# Patient Record
Sex: Female | Born: 1990 | Race: White | Hispanic: No | Marital: Married | Smoking: Never smoker
Health system: Southern US, Community
[De-identification: ages and names within clinical notes are randomized; demographics above are authoritative.]

## PROBLEM LIST (undated history)

## (undated) ENCOUNTER — Inpatient Hospital Stay (HOSPITAL_COMMUNITY): Payer: Self-pay

## (undated) DIAGNOSIS — O365990 Maternal care for other known or suspected poor fetal growth, unspecified trimester, not applicable or unspecified: Secondary | ICD-10-CM

## (undated) DIAGNOSIS — J45909 Unspecified asthma, uncomplicated: Secondary | ICD-10-CM

## (undated) DIAGNOSIS — F419 Anxiety disorder, unspecified: Secondary | ICD-10-CM

## (undated) DIAGNOSIS — T7840XA Allergy, unspecified, initial encounter: Secondary | ICD-10-CM

## (undated) DIAGNOSIS — F329 Major depressive disorder, single episode, unspecified: Secondary | ICD-10-CM

## (undated) DIAGNOSIS — F32A Depression, unspecified: Secondary | ICD-10-CM

## (undated) DIAGNOSIS — I10 Essential (primary) hypertension: Secondary | ICD-10-CM

## (undated) DIAGNOSIS — O142 HELLP syndrome: Secondary | ICD-10-CM

## (undated) HISTORY — DX: Allergy, unspecified, initial encounter: T78.40XA

## (undated) HISTORY — PX: WISDOM TOOTH EXTRACTION: SHX21

## (undated) HISTORY — PX: APPENDECTOMY: SHX54

---
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
## 2010-04-01 ENCOUNTER — Encounter: Admission: RE | Admit: 2010-04-01 | Discharge: 2010-04-01 | Payer: Self-pay | Admitting: Gastroenterology

## 2010-04-29 ENCOUNTER — Ambulatory Visit (HOSPITAL_COMMUNITY): Admission: RE | Admit: 2010-04-29 | Discharge: 2010-04-29 | Payer: Self-pay | Admitting: Gastroenterology

## 2010-10-26 ENCOUNTER — Encounter: Payer: Self-pay | Admitting: Gastroenterology

## 2012-09-25 ENCOUNTER — Ambulatory Visit (INDEPENDENT_AMBULATORY_CARE_PROVIDER_SITE_OTHER): Payer: BC Managed Care – PPO | Admitting: Physician Assistant

## 2012-09-25 VITALS — BP 118/82 | HR 67 | Temp 98.8°F | Resp 16 | Ht 67.0 in | Wt 138.0 lb

## 2012-09-25 DIAGNOSIS — H6980 Other specified disorders of Eustachian tube, unspecified ear: Secondary | ICD-10-CM

## 2012-09-25 DIAGNOSIS — H6990 Unspecified Eustachian tube disorder, unspecified ear: Secondary | ICD-10-CM

## 2012-09-25 DIAGNOSIS — J029 Acute pharyngitis, unspecified: Secondary | ICD-10-CM

## 2012-09-25 MED ORDER — FLUTICASONE PROPIONATE 50 MCG/ACT NA SUSP: 2.0000 | Freq: Every day | NASAL | Status: DC

## 2012-09-25 NOTE — Progress Notes (Signed)
  Subjective:    Patient ID: Isabel Hubbard, female    DOB: [birth year], 21 y.o.   MRN: [id number]  HPI 21 yr old CF presents with sore throat that started in the middle of the night. 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  She works in the [employer].  No known exposures to strep or flu.  No f/c. No cough, runny nose.  Review of Systems  All other systems reviewed and are negative.       Objective:   Physical Exam  Nursing note and vitals reviewed. Constitutional: She appears well-developed and well-nourished.  HENT:  Head: Normocephalic and atraumatic.  Right Ear: External ear normal.  Left Ear: External ear normal.  Mouth/Throat: No oropharyngeal exudate (throat with erythema, no exudate. ).  Neck: Normal range of motion. Neck supple.  Cardiovascular: Normal rate, regular rhythm and normal heart sounds.   Pulmonary/Chest: Effort normal and breath sounds normal.  Lymphadenopathy:    She has no cervical adenopathy.    Results for orders placed in visit on 09/25/12  POCT RAPID STREP A (OFFICE)      Component Value Range   Rapid Strep A Screen Negative  Negative       Assessment & Plan:  Non strep pharyngitis-salt water gargles, tylenol or advil Eustachian tube dysfunction-start flonase and mucinex D Fluids and rest.

## 2013-01-11 ENCOUNTER — Ambulatory Visit (INDEPENDENT_AMBULATORY_CARE_PROVIDER_SITE_OTHER): Payer: BC Managed Care – PPO | Admitting: Family Medicine

## 2013-01-11 VITALS — BP 120/74 | HR 114 | Temp 99.1°F | Resp 16 | Ht 66.25 in | Wt 131.0 lb

## 2013-01-11 DIAGNOSIS — J029 Acute pharyngitis, unspecified: Secondary | ICD-10-CM

## 2013-01-11 DIAGNOSIS — N946 Dysmenorrhea, unspecified: Secondary | ICD-10-CM

## 2013-01-11 DIAGNOSIS — Z23 Encounter for immunization: Secondary | ICD-10-CM

## 2013-01-11 DIAGNOSIS — Z0289 Encounter for other administrative examinations: Secondary | ICD-10-CM

## 2013-01-11 LAB — POCT URINE PREGNANCY: Preg Test, Ur: NEGATIVE

## 2013-01-11 NOTE — Patient Instructions (Addendum)
I recommend frequent warm salt water gargles, hot tea with honey and lemon, rest, and handwashing.  Hot showers or breathing in steam may help loosen any congestion.

## 2013-01-11 NOTE — Progress Notes (Signed)
Subjective:    Patient ID: Isabel Hubbard, female    DOB: 04/08/1991, 22 y.o.   MRN: [id number] Chief Complaint  Patient presents with  . administrative PE  . Immunizations    TB and Tdap  . 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  Brought her UTD immunizations forms w/ her. Had a Td in 2012 but has not had a pertussis update.  Has been on birth control for years. Never misses it.  Since starting it she rarely gets her period - just gets her period about twice a year though she is taking all the placebo pills. Her gynecologist told her that was normal but it always makes her nervous that she could be pregnant and not know it.  Woke up this morning with a sore throat. Has been having a little congestion but not bad. Felt a lot of chills today and feels warm to but unsure about temp.  No cough.   Tuberculosis Risk Questionnaire  1. Were you born outside the [place] in one of the following parts of the world:    [place], [place], [place], [place] or [place]?  no  2. Have you traveled outside the [place] and lived for more than one month in one of the following parts of the world:  [place], [place], [place], [place] or [place]?  No  3. Do you have a compromised immune system such as from any of the following conditions:  HIV/AIDS, organ or bone marrow transplantation, diabetes, immunosuppressive   medicines (e.g. Prednisone, Remicaide), leukemia, lymphoma, cancer of the   head or neck, gastrectomy or jejunal bypass, end-stage renal disease (on   dialysis), or silicosis?  no    4. Have you ever done one of the following:    Used crack cocaine, injected illegal drugs, worked or resided in jail or prison,   worked or resided at a homeless shelter, or worked as a Research scientist (physical sciences) in   direct contact with patients?  no  5. Have you ever been exposed to anyone with  infectious tuberculosis?  No    Tuberculosis Symptom Questionnaire  Do you currently have any of the following symptoms?  1. Unexplained cough lasting more than 3 weeks? no  Unexplained fever lasting more than 3 weeks. no  3. Night Sweats (sweating that leaves the bedclothes and sheets wet)   no 4. Shortness of Breath no  5. Chest Pain no  6. Unintentional weight loss  no  7. Unexplained fatigue (very tired for no reason) no  To Whom It May Concern:  This patient was screened for tuberculosis using current guidelines from the Centers for Disease Control, and does not have tuberculosis in the communicable form.   Review of Systems  Constitutional: Positive for chills. Negative for fever, diaphoresis, activity change, appetite change, fatigue and unexpected weight change.  HENT: Positive for sore throat and rhinorrhea. Negative for ear pain, nosebleeds, congestion, sneezing, mouth sores, trouble swallowing, neck pain, neck stiffness, voice change, postnasal drip, sinus pressure and ear discharge.   Eyes: Negative for pain and itching.  Respiratory: Negative for cough and shortness of breath.   Cardiovascular: Negative for chest pain.  Gastrointestinal: Negative for nausea, vomiting, abdominal pain, diarrhea and constipation.  Genitourinary: Negative for dysuria, menstrual problem and pelvic pain.  Musculoskeletal: Negative for myalgias, joint swelling, arthralgias and gait problem.  Neurological:  Negative for dizziness, syncope and headaches.  Hematological: Negative for adenopathy.  Psychiatric/Behavioral: Negative for sleep disturbance.      BP 120/74  Pulse 114  Temp(Src) 99.1 F (37.3 C) (Oral)  Resp 16  Ht 5' 6.25" (1.683 m)  Wt 131 lb (59.421 kg)  BMI 20.98 kg/m2  SpO2 100% Objective:   Physical Exam  Constitutional: She is oriented to person, place, and time. She appears well-developed and well-nourished. No distress.  HENT:  Head: Normocephalic and  atraumatic. No trismus in the jaw.  Right Ear: Tympanic membrane, external ear and ear canal normal.  Left Ear: Tympanic membrane, external ear and ear canal normal.  Nose: Rhinorrhea present. No mucosal edema.  Mouth/Throat: Uvula is midline and mucous membranes are normal. Mucous membranes are not pale and not dry. No edematous. No oropharyngeal exudate, posterior oropharyngeal edema, posterior oropharyngeal erythema or tonsillar abscesses.  1+ tonsil on right, 0 on left  Eyes: Conjunctivae are normal. Right eye exhibits no discharge. Left eye exhibits no discharge. No scleral icterus.  Neck: Normal range of motion. Neck supple.  Cardiovascular: Normal rate, regular rhythm, normal heart sounds and intact distal pulses.   Pulmonary/Chest: Effort normal and breath sounds normal. No respiratory distress.  Lymphadenopathy:       Head (right side): No submandibular, no tonsillar, no preauricular, no posterior auricular and no occipital adenopathy present.       Head (left side): No submandibular, no tonsillar, no preauricular, no posterior auricular and no occipital adenopathy present.    She has no cervical adenopathy.       Right cervical: No superficial cervical and no posterior cervical adenopathy present.      Left cervical: No superficial cervical and no posterior cervical adenopathy present.       Right: No supraclavicular adenopathy present.       Left: No supraclavicular adenopathy present.  Neurological: She is alert and oriented to person, place, and time. No cranial nerve deficit. She exhibits normal muscle tone. Gait normal.  Skin: Skin is warm and dry. She is not diaphoretic. No erythema.  Psychiatric: She has a normal mood and affect. Her behavior is normal.       Results for orders placed in visit on 01/11/13  POCT RAPID STREP A (OFFICE)      Result Value Range   Rapid Strep A Screen Negative  Negative  POCT URINE PREGNANCY      Result Value Range   Preg Test, Ur Negative       Assessment & Plan:  Sore throat - Plan: POCT rapid strep A neg - Viral pharyngitis - pt reassured. Push fluids, salt water gargles. 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 Form and immunization records copied and scanned.

## 2013-11-01 ENCOUNTER — Ambulatory Visit (INDEPENDENT_AMBULATORY_CARE_PROVIDER_SITE_OTHER): Payer: BC Managed Care – PPO | Admitting: Physician Assistant

## 2013-11-01 VITALS — BP 120/70 | HR 72 | Temp 98.7°F | Resp 16 | Wt 146.0 lb

## 2013-11-01 DIAGNOSIS — J0100 Acute maxillary sinusitis, unspecified: Secondary | ICD-10-CM

## 2013-11-01 MED ORDER — AZITHROMYCIN 250 MG PO TABS: ORAL_TABLET | ORAL | Status: DC

## 2013-11-01 MED ORDER — PREDNISONE 20 MG PO TABS: ORAL_TABLET | ORAL | Status: DC

## 2013-11-01 MED ORDER — BENZONATATE 100 MG PO CAPS: 100.0000 mg | ORAL_CAPSULE | Freq: Four times a day (QID) | ORAL | Status: DC | PRN

## 2013-11-01 NOTE — Progress Notes (Signed)
   Subjective:    Patient ID: Isabel Hubbard, female    DOB: August 28, 1991, 23 y.o.   MRN: [id number]  Cough This is a new problem. The current episode started in the past 7 days. The problem has been gradually worsening. The problem occurs constantly. The cough is productive of purulent sputum. Associated symptoms include chills, ear congestion, ear pain, a fever, nasal congestion, postnasal drip and rhinorrhea. Pertinent negatives include no chest pain, headaches, heartburn, hemoptysis, myalgias, rash, sore throat, shortness of breath, sweats, weight loss or wheezing. The symptoms are aggravated by lying down. Treatments tried: dayquil.    Review of Systems  Constitutional: Positive for fever and chills. Negative for weight loss.  HENT: Positive for ear pain, postnasal drip and rhinorrhea. Negative for sore throat.   Respiratory: Positive for cough. Negative for hemoptysis, shortness of breath and wheezing.   Cardiovascular: Negative for chest pain.  Gastrointestinal: Negative for heartburn.  Musculoskeletal: Negative for myalgias.  Skin: Negative for rash.  Neurological: Negative for headaches.       Objective:   Physical Exam  Constitutional: She appears well-developed and well-nourished.  HENT:  Head: Normocephalic and atraumatic.  Right Ear: External ear normal.  Nose: Right sinus exhibits frontal sinus tenderness. Left sinus exhibits frontal sinus tenderness.  Eyes: Conjunctivae and EOM are normal.  Neck: Normal range of motion. Neck supple.  Cardiovascular: Normal rate, regular rhythm, normal heart sounds and intact distal pulses.   Pulmonary/Chest: Effort normal and breath sounds normal. No respiratory distress. She has no wheezes.  Abdominal: Soft. Bowel sounds are normal.  Lymphadenopathy:    She has cervical adenopathy.  Skin: Skin is warm and dry.      Assessment & Plan:  Acute maxillary sinusitis - Plan: azithromycin (ZITHROMAX) 250 MG tablet, benzonatate (TESSALON  PERLES) 100 MG capsule, predniSONE (DELTASONE) 20 MG tablet

## 2013-11-01 NOTE — Patient Instructions (Signed)

## 2013-11-02 ENCOUNTER — Other Ambulatory Visit: Payer: Self-pay | Admitting: Physician Assistant

## 2013-11-02 MED ORDER — PROMETHAZINE-CODEINE 6.25-10 MG/5ML PO SYRP: 5.0000 mL | ORAL_SOLUTION | Freq: Four times a day (QID) | ORAL | Status: DC | PRN

## 2013-11-13 ENCOUNTER — Ambulatory Visit (INDEPENDENT_AMBULATORY_CARE_PROVIDER_SITE_OTHER): Payer: BC Managed Care – PPO | Admitting: Physician Assistant

## 2013-11-13 ENCOUNTER — Encounter: Payer: Self-pay | Admitting: Physician Assistant

## 2013-11-13 VITALS — BP 100/62 | HR 100 | Temp 99.3°F | Resp 16 | Wt 143.0 lb

## 2013-11-13 DIAGNOSIS — R109 Unspecified abdominal pain: Secondary | ICD-10-CM

## 2013-11-13 DIAGNOSIS — I10 Essential (primary) hypertension: Secondary | ICD-10-CM

## 2013-11-13 DIAGNOSIS — R112 Nausea with vomiting, unspecified: Secondary | ICD-10-CM

## 2013-11-13 DIAGNOSIS — R197 Diarrhea, unspecified: Secondary | ICD-10-CM

## 2013-11-13 LAB — CBC WITH DIFFERENTIAL/PLATELET
BASOS PCT: 0 % (ref 0–1)
Basophils Absolute: 0 10*3/uL (ref 0.0–0.1)
EOS ABS: 0 10*3/uL (ref 0.0–0.7)
Eosinophils Relative: 0 % (ref 0–5)
HEMATOCRIT: 42.5 % (ref 36.0–46.0)
HEMOGLOBIN: 15.2 g/dL — AB (ref 12.0–15.0)
LYMPHS ABS: 0.2 10*3/uL — AB (ref 0.7–4.0)
LYMPHS PCT: 2 % — AB (ref 12–46)
MCH: 31.1 pg (ref 26.0–34.0)
MCHC: 35.8 g/dL (ref 30.0–36.0)
MCV: 87.1 fL (ref 78.0–100.0)
MONOS PCT: 3 % (ref 3–12)
Monocytes Absolute: 0.3 10*3/uL (ref 0.1–1.0)
Neutro Abs: 11.6 10*3/uL — ABNORMAL HIGH (ref 1.7–7.7)
Neutrophils Relative %: 95 % — ABNORMAL HIGH (ref 43–77)
Platelets: 231 10*3/uL (ref 150–400)
RBC: 4.88 MIL/uL (ref 3.87–5.11)
RDW: 13.3 % (ref 11.5–15.5)
WBC: 12.2 10*3/uL — AB (ref 4.0–10.5)

## 2013-11-13 LAB — BASIC METABOLIC PANEL WITH GFR
BUN: 11 mg/dL (ref 6–23)
CO2: 26 mEq/L (ref 19–32)
Calcium: 9.3 mg/dL (ref 8.4–10.5)
Chloride: 104 mEq/L (ref 96–112)
Creat: 0.71 mg/dL (ref 0.50–1.10)
GFR, Est Non African American: >89 mL/min
GLUCOSE: 129 mg/dL — AB (ref 70–99)
Potassium: 3.9 mEq/L (ref 3.5–5.3)
Sodium: 142 mEq/L (ref 135–145)

## 2013-11-13 LAB — POCT URINE PREGNANCY: PREG TEST UR: NEGATIVE

## 2013-11-13 LAB — HEPATIC FUNCTION PANEL
ALBUMIN: 4.4 g/dL (ref 3.5–5.2)
ALK PHOS: 43 U/L (ref 39–117)
ALT: 11 U/L (ref 0–35)
AST: 16 U/L (ref 0–37)
BILIRUBIN INDIRECT: 0.6 mg/dL (ref 0.2–1.2)
BILIRUBIN TOTAL: 0.8 mg/dL (ref 0.2–1.2)
Bilirubin, Direct: 0.2 mg/dL (ref 0.0–0.3)
Total Protein: 7.4 g/dL (ref 6.0–8.3)

## 2013-11-13 MED ORDER — CIPROFLOXACIN HCL 500 MG PO TABS: 500.0000 mg | ORAL_TABLET | Freq: Two times a day (BID) | ORAL | Status: AC

## 2013-11-13 MED ORDER — HYOSCYAMINE SULFATE 0.125 MG PO TABS: 0.1250 mg | ORAL_TABLET | ORAL | Status: DC | PRN

## 2013-11-13 MED ORDER — PROMETHAZINE HCL 25 MG PO TABS: 25.0000 mg | ORAL_TABLET | Freq: Four times a day (QID) | ORAL | Status: DC | PRN

## 2013-11-13 MED ORDER — PANTOPRAZOLE SODIUM 40 MG PO TBEC: 40.0000 mg | DELAYED_RELEASE_TABLET | Freq: Every day | ORAL | Status: DC

## 2013-11-13 NOTE — Progress Notes (Signed)
   Subjective:    Patient ID: Isabel Hubbard, female    DOB: [birth year], 23 y.o.   MRN: [id number]  HPI Started to have stomach pain last night, then woke up at 12 AM and had vomiting and diarrhea. Has vomited 4 times with no blood, diarrrhea every 30 mins without blood or black stools. Chills and low grade temp at home. She continues to have lower abdominal pain and in her lower back. Denies CP, SOB, cough, sinus congestion, LUTS.   On BCP. And does not get menses. Has had appy, history of abnormal HIDA Review of Systems  Constitutional: Positive for fever, chills and fatigue.  HENT: Negative.   Eyes: Negative.   Respiratory: Negative.   Cardiovascular: Negative.   Gastrointestinal: Positive for nausea, vomiting, abdominal pain and diarrhea. Negative for constipation, blood in stool and rectal pain.  Genitourinary: Negative.   Musculoskeletal: Positive for back pain.  Skin: Negative.   Neurological: Negative.        Objective:   Physical Exam  Vitals reviewed. Constitutional: She is oriented to person, place, and time. She appears well-developed and well-nourished.  Neck: Normal range of motion. Neck supple.  Cardiovascular: Normal rate and regular rhythm.   Pulmonary/Chest: Effort normal and breath sounds normal.  Abdominal: Soft. Bowel sounds are normal. She exhibits no mass. There is no hepatosplenomegaly, splenomegaly or hepatomegaly. There is generalized tenderness. There is guarding. There is no rebound and no CVA tenderness.  Neurological: She is alert and oriented to person, place, and time.  Skin: Skin is warm and dry. No rash noted.       Assessment & Plan:  Abdominal pain with diarrhea/vomiting- likely gastroenteritis/gallbladder/ulcer Urine preg neg- ruled out ectopic Levsin 0.125. PPI, phenergan check labs includingCBC, BMP, LFTs bland diet, small portions, increase H20 if pain continues will return for abdominal U/S.  Patient advised to go to the ER if the  symptoms increase or worsen.   Note given for work

## 2013-11-13 NOTE — Patient Instructions (Signed)
Viral Gastroenteritis Viral gastroenteritis is also called stomach flu. This illness is caused by a certain type of germ (virus). It can cause sudden watery poop (diarrhea) and throwing up (vomiting). This can cause you to lose body fluids (dehydration). This illness usually lasts for 3 to 8 days. It usually goes away on its own. HOME CARE   Drink enough fluids to keep your pee (urine) clear or pale yellow. Drink small amounts of fluids often.  1/2 Gatorade and 1/2 water  Ask your doctor how to replace body fluid losses (rehydration).  Avoid:  Foods high in sugar.  Alcohol.  Bubbly (carbonated) drinks.  Tobacco.  Juice.  Caffeine drinks.  Very hot or cold fluids.  Fatty, greasy foods.  Eating too much at one time.  Dairy products until 24 to 48 hours after your watery poop stops.  You may eat foods with active cultures (probiotics). They can be found in some yogurts and supplements.  Wash your hands well to avoid spreading the illness.  Only take medicines as told by your doctor. Do not give aspirin to children. Do not take medicines for watery poop (antidiarrheals).  Ask your doctor if you should keep taking your regular medicines.  Keep all doctor visits as told. GET HELP RIGHT AWAY IF:   You cannot keep fluids down.  You do not pee at least once every 6 to 8 hours.  You are short of breath.  You see blood in your poop or throw up. This may look like coffee grounds.  You have belly (abdominal) pain that gets worse or is just in one small spot (localized).  You keep throwing up or having watery poop.  You have a fever.  The patient is a child younger than 3 months, and he or she has a fever.  The patient is a child older than 3 months, and he or she has a fever and problems that do not go away.  The patient is a child older than 3 months, and he or she has a fever and problems that suddenly get worse.  The patient is a baby, and he or she has no tears  when crying. MAKE SURE YOU:   Understand these instructions.  Will watch your condition.  Will get help right away if you are not doing well or get worse. Document Released: 03/09/2008 Document Revised: 12/14/2011 Document Reviewed: 07/08/2011 [facility] Patient Information 2014 [employer] [place], [place].

## 2013-11-13 NOTE — Addendum Note (Signed)
Addended by: Quentin MullingOLLIER, AMANDA R on: 11/13/2013 05:41 PM   Modules accepted: Orders

## 2013-11-16 ENCOUNTER — Other Ambulatory Visit: Payer: Self-pay

## 2013-11-20 ENCOUNTER — Ambulatory Visit
Admission: RE | Admit: 2013-11-20 | Discharge: 2013-11-20 | Disposition: A | Discharge status: Home / Self Care | Payer: BC Managed Care – PPO | Source: Ambulatory Visit | Attending: Physician Assistant | Admitting: Physician Assistant

## 2013-11-20 DIAGNOSIS — R112 Nausea with vomiting, unspecified: Secondary | ICD-10-CM

## 2013-11-20 DIAGNOSIS — R109 Unspecified abdominal pain: Secondary | ICD-10-CM

## 2014-04-16 ENCOUNTER — Encounter: Payer: Self-pay | Admitting: Physician Assistant

## 2014-04-16 ENCOUNTER — Ambulatory Visit: Payer: BC Managed Care – PPO | Admitting: Emergency Medicine

## 2014-04-16 ENCOUNTER — Ambulatory Visit (INDEPENDENT_AMBULATORY_CARE_PROVIDER_SITE_OTHER): Payer: BC Managed Care – PPO | Admitting: Physician Assistant

## 2014-04-16 VITALS — BP 120/70 | HR 76 | Temp 98.6°F | Resp 16 | Wt 151.0 lb

## 2014-04-16 DIAGNOSIS — J029 Acute pharyngitis, unspecified: Secondary | ICD-10-CM

## 2014-04-16 MED ORDER — RIZATRIPTAN BENZOATE 10 MG PO TBDP: 10.0000 mg | ORAL_TABLET | ORAL | Status: DC | PRN

## 2014-04-16 MED ORDER — DEXAMETHASONE SODIUM PHOSPHATE 10 MG/ML IJ SOLN
10.0000 mg | Freq: Once | INTRAMUSCULAR | Status: AC
  Administered 2014-04-16: 10 mg via INTRAMUSCULAR

## 2014-04-16 MED ORDER — AZITHROMYCIN 250 MG PO TABS: 250.0000 mg | ORAL_TABLET | Freq: Every day | ORAL | Status: DC

## 2014-04-16 MED ORDER — FLUCONAZOLE 150 MG PO TABS: 150.0000 mg | ORAL_TABLET | Freq: Every day | ORAL | Status: DC

## 2014-04-16 NOTE — Progress Notes (Signed)
   Subjective:    Patient ID: Isabel Hubbard, female    DOB: 1991/09/13, 23 y.o.   MRN: [id number]  HPI 23 y.o. female with history of sore throat starting last night. Progressively worse pain with swallowing. Husband was here on Friday and treated for strep, was not tested. Denies fever, chills, cough, SOB, neck pain, nausea, diarrhea, CP.    Review of Systems  Constitutional: Negative for fever, chills, diaphoresis and fatigue.  HENT: Positive for sore throat and trouble swallowing. Negative for congestion, ear pain, sinus pressure, sneezing, tinnitus and voice change.   Respiratory: Negative.  Negative for cough and shortness of breath.   Cardiovascular: Negative.   Musculoskeletal: Positive for neck pain.  Neurological: Negative.  Negative for headaches.       Objective:   Physical Exam  Constitutional: She appears well-developed and well-nourished.  HENT:  Head: Normocephalic and atraumatic.  Right Ear: External ear normal.  Left Ear: External ear normal.  Mouth/Throat: Uvula is midline and mucous membranes are normal. Posterior oropharyngeal edema and posterior oropharyngeal erythema present.  Eyes: Conjunctivae and EOM are normal. Pupils are equal, round, and reactive to light.  Neck: Normal range of motion. Neck supple.  Cardiovascular: Normal rate, regular rhythm and normal heart sounds.   Pulmonary/Chest: Effort normal and breath sounds normal.  Abdominal: Soft. Bowel sounds are normal.  Lymphadenopathy:    She has no cervical adenopathy.  Skin: Skin is warm and dry.       Assessment & Plan:  1. Acute pharyngitis, unspecified pharyngitis type [462] Likely viral, will do conservative therapy, salt water gargles, tylenol/NSAIDS, prednisone shot, if worse will take Zpak - azithromycin (ZITHROMAX) 250 MG tablet; Take 1 tablet (250 mg total) by mouth daily.  Dispense: 6 each; Refill: 0 - dexamethasone (DECADRON) injection 10 mg; Inject 1 mL (10 mg total) into the muscle  once. - fluconazole (DIFLUCAN) 150 MG tablet; Take 1 tablet (150 mg total) by mouth daily.  Dispense: 1 tablet; Refill: 3

## 2014-04-16 NOTE — Patient Instructions (Signed)

## 2014-06-12 ENCOUNTER — Encounter: Payer: Self-pay | Admitting: Physician Assistant

## 2014-06-12 ENCOUNTER — Telehealth: Payer: Self-pay | Admitting: *Deleted

## 2014-06-12 ENCOUNTER — Ambulatory Visit (INDEPENDENT_AMBULATORY_CARE_PROVIDER_SITE_OTHER): Payer: BC Managed Care – PPO | Admitting: Physician Assistant

## 2014-06-12 VITALS — BP 110/68 | HR 72 | Temp 98.1°F | Resp 16 | Ht 67.0 in | Wt 157.0 lb

## 2014-06-12 DIAGNOSIS — J0100 Acute maxillary sinusitis, unspecified: Secondary | ICD-10-CM

## 2014-06-12 MED ORDER — DEXAMETHASONE SODIUM PHOSPHATE 10 MG/ML IJ SOLN
10.0000 mg | Freq: Once | INTRAMUSCULAR | Status: AC
  Administered 2014-06-12: 10 mg via INTRAMUSCULAR

## 2014-06-12 MED ORDER — AZITHROMYCIN 250 MG PO TABS: ORAL_TABLET | ORAL | Status: AC

## 2014-06-12 NOTE — Progress Notes (Signed)
   Subjective:    Patient ID: Isabel Hubbard, female    DOB: 17-Jan-1991, 23 y.o.   MRN: [id number]  Sinus Problem This is a new problem. Episode onset: 4-5 days. The problem has been gradually worsening since onset. There has been no fever. Associated symptoms include congestion, coughing (started today), ear pain and sinus pressure. Pertinent negatives include no chills, shortness of breath, sneezing or sore throat. Treatments tried: [employer] PE started on Sunday. The treatment provided mild relief.    Review of Systems  Constitutional: Positive for fatigue. Negative for fever and chills.  HENT: Positive for congestion, ear pain, postnasal drip, rhinorrhea and sinus pressure. Negative for dental problem, drooling, ear discharge, facial swelling, hearing loss, mouth sores, nosebleeds, sneezing, sore throat, tinnitus, trouble swallowing and voice change.   Respiratory: Positive for cough (started today). Negative for apnea, choking, chest tightness, shortness of breath, wheezing and stridor.   Gastrointestinal: Negative.   Genitourinary: Negative.   Musculoskeletal: Positive for myalgias.  Neurological: Negative.        Objective:   Physical Exam  Constitutional: She appears well-developed and well-nourished.  HENT:  Head: Normocephalic and atraumatic.  Right Ear: External ear normal.  Nose: Right sinus exhibits maxillary sinus tenderness. Right sinus exhibits no frontal sinus tenderness. Left sinus exhibits maxillary sinus tenderness. Left sinus exhibits no frontal sinus tenderness.  Eyes: Conjunctivae and EOM are normal.  Neck: Normal range of motion. Neck supple.  Cardiovascular: Normal rate, regular rhythm, normal heart sounds and intact distal pulses.   Pulmonary/Chest: Effort normal and breath sounds normal. No respiratory distress. She has no wheezes.  Abdominal: Soft. Bowel sounds are normal.  Lymphadenopathy:    She has no cervical adenopathy.  Skin: Skin is warm and dry.        Assessment & Plan:  Allergic rhinitis/viral sinusitis- Allegra OTC, increase H20, allergy hygiene explained, can continue walfed, prednisone shot, and hold zpak.

## 2014-06-12 NOTE — Telephone Encounter (Signed)
Spouse called and states patient has congestion, cough and stuffy nose x 4 days.  Per Dr Oneta Rack, try OTC Actifed or Dimetapp and come in for OV if does not improve.  Spouse aware.

## 2014-06-12 NOTE — Patient Instructions (Signed)

## 2014-06-20 ENCOUNTER — Other Ambulatory Visit: Payer: Self-pay

## 2014-06-20 ENCOUNTER — Other Ambulatory Visit: Payer: Self-pay | Admitting: Internal Medicine

## 2014-06-20 NOTE — Telephone Encounter (Signed)
Patient request Triamcinolone cream 1% , ok to call in per Dr Oneta Rack, called to [facility] [phone]

## 2014-08-02 ENCOUNTER — Other Ambulatory Visit: Payer: Self-pay | Admitting: Gastroenterology

## 2014-08-02 DIAGNOSIS — R1013 Epigastric pain: Secondary | ICD-10-CM

## 2014-08-06 ENCOUNTER — Ambulatory Visit: Payer: Self-pay | Admitting: Physician Assistant

## 2014-08-06 ENCOUNTER — Ambulatory Visit (INDEPENDENT_AMBULATORY_CARE_PROVIDER_SITE_OTHER): Payer: BC Managed Care – PPO | Admitting: Physician Assistant

## 2014-08-06 ENCOUNTER — Encounter: Payer: Self-pay | Admitting: Physician Assistant

## 2014-08-06 VITALS — BP 118/66 | HR 74 | Temp 99.8°F | Resp 16 | Ht 67.0 in | Wt 155.0 lb

## 2014-08-06 DIAGNOSIS — B379 Candidiasis, unspecified: Secondary | ICD-10-CM

## 2014-08-06 DIAGNOSIS — J45909 Unspecified asthma, uncomplicated: Secondary | ICD-10-CM

## 2014-08-06 DIAGNOSIS — R059 Cough, unspecified: Secondary | ICD-10-CM

## 2014-08-06 DIAGNOSIS — J209 Acute bronchitis, unspecified: Secondary | ICD-10-CM

## 2014-08-06 DIAGNOSIS — R05 Cough: Secondary | ICD-10-CM

## 2014-08-06 LAB — POCT RAPID STREP A (OFFICE): RAPID STREP A SCREEN: NEGATIVE

## 2014-08-06 LAB — POCT INFLUENZA A/B
INFLUENZA A, POC: NEGATIVE
Influenza B, POC: NEGATIVE

## 2014-08-06 MED ORDER — FLUCONAZOLE 150 MG PO TABS: 150.0000 mg | ORAL_TABLET | Freq: Once | ORAL | Status: DC

## 2014-08-06 MED ORDER — AZITHROMYCIN 250 MG PO TABS: ORAL_TABLET | ORAL | Status: AC

## 2014-08-06 MED ORDER — BENZONATATE 100 MG PO CAPS: 200.0000 mg | ORAL_CAPSULE | Freq: Three times a day (TID) | ORAL | Status: DC | PRN

## 2014-08-06 MED ORDER — ALBUTEROL SULFATE HFA 108 (90 BASE) MCG/ACT IN AERS: 2.0000 | INHALATION_SPRAY | RESPIRATORY_TRACT | Status: DC | PRN

## 2014-08-06 NOTE — Progress Notes (Signed)
Subjective:    Patient ID: Isabel Hubbard, female    DOB: [birth year], 23 y.o.   MRN: [id number]  Fever  This is a new problem. Episode onset: 4 days ago. The problem has been unchanged. The maximum temperature noted was 101 to 101.9 F (on saturday.). Associated symptoms include chest pain, congestion, coughing, headaches and a sore throat. Pertinent negatives include no abdominal pain, diarrhea, ear pain, nausea, rash, sleepiness, urinary pain, vomiting or wheezing. Associated symptoms comments: Chills and cough with green/brown mucous. Chest pain with coughing.  Thought she had yeast infection and took diflucan once.. Treatments tried: Mucinex-DM. The treatment provided mild relief.  Cough Cough characteristics: productive with green-brown mucous. Associated symptoms include chest pain, chills, a fever, headaches and a sore throat. Pertinent negatives include no ear congestion, ear pain, heartburn, hemoptysis, myalgias, nasal congestion, postnasal drip, rash, rhinorrhea, shortness of breath, sweats, weight loss or wheezing.   Patient is a 3rd grade teacher.  Review of Systems  Constitutional: Positive for fever, chills and fatigue. Negative for weight loss and appetite change.  HENT: Positive for congestion and sore throat. Negative for ear discharge, ear pain, postnasal drip, rhinorrhea, sinus pressure and trouble swallowing.   Eyes: Negative.   Respiratory: Positive for cough and chest tightness. Negative for hemoptysis, shortness of breath, wheezing and stridor.   Cardiovascular: Positive for chest pain.       Chest pain with cough only.  Gastrointestinal: Negative.  Negative for heartburn, nausea, vomiting, abdominal pain, diarrhea and constipation.  Genitourinary: Negative.  Negative for dysuria.  Musculoskeletal: Negative.  Negative for myalgias.  Skin: Negative.  Negative for rash.  Allergic/Immunologic: Negative.   Neurological: Positive for headaches. Negative for dizziness,  syncope, speech difficulty, weakness and light-headedness.  Psychiatric/Behavioral: Negative.    Past Medical History  Diagnosis Date  . Allergy    Current Outpatient Prescriptions on File Prior to Visit  Medication Sig Dispense Refill  . dicyclomine (BENTYL) 10 MG capsule Take 10 mg by mouth 3 (three) times daily before meals.    . norethindrone-ethinyl estradiol (JUNEL FE,GILDESS FE,LOESTRIN FE) 1-20 MG-MCG tablet Take 1 tablet by mouth daily.    . rizatriptan (MAXALT-MLT) 10 MG disintegrating tablet Take 1 tablet (10 mg total) by mouth as needed for migraine. May repeat in 2 hours if needed 10 tablet 0   No current facility-administered medications on file prior to visit.   Allergies  Allergen Reactions  . Dexamethasone Rash  . Sulfa Antibiotics Rash  . Naproxen Other (See Comments)    GERD/Reflux  . Prednisone Rash     BP 118/66 mmHg  Pulse 74  Temp(Src) 99.8 F (37.7 C) (Temporal)  Resp 16  Ht 5\' 7"  (1.702 m)  Wt 155 lb (70.308 kg)  BMI 24.27 kg/m2  SpO2 98%  Objective:   Physical Exam  Constitutional: She is oriented to person, place, and time. She appears well-developed and well-nourished. She has a sickly appearance. No distress.  HENT:  Head: Normocephalic and atraumatic.  Right Ear: External ear and ear canal normal. No drainage or swelling. Tympanic membrane is bulging. Tympanic membrane is not injected, not scarred, not perforated, not erythematous and not retracted. A middle ear effusion is present.  Left Ear: External ear and ear canal normal. No drainage or swelling. Tympanic membrane is bulging. Tympanic membrane is not injected, not scarred, not perforated, not erythematous and not retracted. A middle ear effusion is present.  Nose: Nose normal. No mucosal edema or rhinorrhea. Right sinus exhibits  no maxillary sinus tenderness and no frontal sinus tenderness. Left sinus exhibits no maxillary sinus tenderness and no frontal sinus tenderness.  Mouth/Throat:  Uvula is midline and mucous membranes are normal. Mucous membranes are not pale and not dry. No trismus in the jaw. No uvula swelling. Posterior oropharyngeal erythema present. No oropharyngeal exudate, posterior oropharyngeal edema or tonsillar abscesses.  Bilateral TMs bulging without erythema or edema.  Clear fluid behind TMs bilaterally.  Eyes: Conjunctivae and lids are normal. Pupils are equal, round, and reactive to light. Right eye exhibits no discharge. Left eye exhibits no discharge. No scleral icterus.  Neck: Trachea normal and normal range of motion. Neck supple. No tracheal tenderness present. No tracheal deviation present.  Cardiovascular: Normal rate, regular rhythm, S1 normal, S2 normal, normal heart sounds, intact distal pulses and normal pulses.  Exam reveals no gallop, no distant heart sounds and no friction rub.   No murmur heard. Pulmonary/Chest: Effort normal and breath sounds normal. No accessory muscle usage or stridor. No tachypnea and no bradypnea. No respiratory distress. She has no decreased breath sounds. She has no wheezes. She has no rhonchi. She has no rales. She exhibits no tenderness.  Abdominal: Soft. Bowel sounds are normal. There is no tenderness. There is no rebound and no guarding.  Musculoskeletal: Normal range of motion.  Lymphadenopathy:       Head (right side): No submental, no submandibular, no tonsillar, no preauricular, no posterior auricular and no occipital adenopathy present.       Head (left side): No submental, no submandibular, no tonsillar, no preauricular, no posterior auricular and no occipital adenopathy present.    She has no cervical adenopathy.       Right: No supraclavicular adenopathy present.       Left: No supraclavicular adenopathy present.  Neurological: She is alert and oriented to person, place, and time. She has normal strength.  Skin: Skin is warm, dry and intact. No rash noted. She is not diaphoretic. No cyanosis. Nails show no  clubbing.  Psychiatric: She has a normal mood and affect. Her speech is normal and behavior is normal. Judgment and thought content normal. Cognition and memory are normal.  Vitals reviewed.     Assessment & Plan:  1. Acute bronchitis, unspecified organism -Take albuterol as prescribed to open airway- albuterol (VENTOLIN HFA) 108 (90 BASE) MCG/ACT inhaler; Inhale 2 puffs into the lungs every 4 (four) hours as needed for wheezing or shortness of breath.  Dispense: 1 Inhaler; Refill: 1 -Take tessalon perles as prescribed for cough- benzonatate (TESSALON PERLES) 100 MG capsule; Take 2 capsules (200 mg total) by mouth 3 (three) times daily as needed for cough.  Dispense: 120 capsule; Refill: 0 -Take Mucinex- Maximum Strength- follow directions on box.  Will help thin out mucous and cough it up. -Drink plenty of fluids to stay hydrated and help thin out mucous. -Take the Z-Pak as prescribed starting 08/12/14 if you are not feeling better- azithromycin (ZITHROMAX Z-PAK) 250 MG tablet; Take 2 tablets PO on day 1, then take 1 tablet PO QDaily for 4 days.  Dispense: 6 tablet; Refill: 0 - POCT rapid strep A- Negative - POCT Influenza A/B- Negative -While drinking fluids, pinch and hold nose close and swallow to open up eustachian tubes to drain them. -Note given for out of work- 08/06/14 to 08/08/14.  2. Cough -Take Tessalon perles as prescribed- benzonatate (TESSALON PERLES) 100 MG capsule; Take 2 capsules (200 mg total) by mouth 3 (three) times daily as needed  for cough.  Dispense: 120 capsule; Refill: 0 - POCT rapid strep A- Negative - POCT Influenza A/B-Negative  3. Yeast infection Patient had previous yeast infection.  Patient is prone to yeast infections when on antibiotics.  Only take if you are going to take antibiotic. - fluconazole (DIFLUCAN) 150 MG tablet; Take 1 tablet (150 mg total) by mouth once.  Dispense: 1 tablet; Refill: 1  If you are not better in 10-14 days, then please call the  office.    Barnaby Rippeon, Lise AuerJennifer L, PA-C 6:15 PM [facility]

## 2014-08-06 NOTE — Patient Instructions (Addendum)
-Take albuterol inhaler as prescribed.  It will help open up your airways. -Take Tessalon Perles as prescribed for cough. -Take Mucinex Max Strength- follow directions on box.  Will help thin out mucous and help it come out easier. -Take Z-Pak starting 08/12/14 if you are not better.   -Drink plenty of fluids to stay hydrated.    If you are not better in 10-14 days, then please call office.  Acute Bronchitis Bronchitis is inflammation of the airways that extend from the windpipe into the lungs (bronchi). The inflammation often causes mucus to develop. This leads to a cough, which is the most common symptom of bronchitis.  In acute bronchitis, the condition usually develops suddenly and goes away over time, usually in a couple weeks. Smoking, allergies, and asthma can make bronchitis worse. Repeated episodes of bronchitis may cause further lung problems.  CAUSES Acute bronchitis is most often caused by the same virus that causes a cold. The virus can spread from person to person (contagious) through coughing, sneezing, and touching contaminated objects. SIGNS AND SYMPTOMS   Cough.   Fever.   Coughing up mucus.   Body aches.   Chest congestion.   Chills.   Shortness of breath.   Sore throat.  DIAGNOSIS  Acute bronchitis is usually diagnosed through a physical exam. Your health care provider will also ask you questions about your medical history. Tests, such as chest X-rays, are sometimes done to rule out other conditions.  TREATMENT  Acute bronchitis usually goes away in a couple weeks. Oftentimes, no medical treatment is necessary. Medicines are sometimes given for relief of fever or cough. Antibiotic medicines are usually not needed but may be prescribed in certain situations. In some cases, an inhaler may be recommended to help reduce shortness of breath and control the cough. A cool mist vaporizer may also be used to help thin bronchial secretions and make it easier to clear  the chest.  HOME CARE INSTRUCTIONS  Get plenty of rest.   Drink enough fluids to keep your urine clear or pale yellow (unless you have a medical condition that requires fluid restriction). Increasing fluids may help thin your respiratory secretions (sputum) and reduce chest congestion, and it will prevent dehydration.   Take medicines only as directed by your health care provider.  If you were prescribed an antibiotic medicine, finish it all even if you start to feel better.  Avoid smoking and secondhand smoke. Exposure to cigarette smoke or irritating chemicals will make bronchitis worse. If you are a smoker, consider using nicotine gum or skin patches to help control withdrawal symptoms. Quitting smoking will help your lungs heal faster.   Reduce the chances of another bout of acute bronchitis by washing your hands frequently, avoiding people with cold symptoms, and trying not to touch your hands to your mouth, nose, or eyes.   Keep all follow-up visits as directed by your health care provider.  SEEK MEDICAL CARE IF: Your symptoms do not improve after 1 week of treatment.  SEEK IMMEDIATE MEDICAL CARE IF:  You develop an increased fever or chills.   You have chest pain.   You have severe shortness of breath.  You have bloody sputum.   You develop dehydration.  You faint or repeatedly feel like you are going to pass out.  You develop repeated vomiting.  You develop a severe headache. MAKE SURE YOU:   Understand these instructions.  Will watch your condition.  Will get help right away if you are  not doing well or get worse. Document Released: 10/29/2004 Document Revised: 02/05/2014 Document Reviewed: 03/14/2013 [facility] Patient Information 2015 [place], [place]. This information is not intended to replace advice given to you by your health care provider. Make sure you discuss any questions you have with your health care provider.

## 2014-08-13 ENCOUNTER — Telehealth: Payer: Self-pay | Admitting: Physician Assistant

## 2014-08-13 DIAGNOSIS — R059 Cough, unspecified: Secondary | ICD-10-CM

## 2014-08-13 DIAGNOSIS — R05 Cough: Secondary | ICD-10-CM

## 2014-08-13 MED ORDER — PROMETHAZINE-CODEINE 6.25-10 MG/5ML PO SYRP: 5.0000 mL | ORAL_SOLUTION | Freq: Four times a day (QID) | ORAL | Status: DC | PRN

## 2014-08-13 NOTE — Telephone Encounter (Signed)
[facility]  Advised patient to try Rx and call if no relief with symptoms or if any other side effects occur.

## 2014-08-13 NOTE — Telephone Encounter (Signed)
Patient called on 08/13/14 at 11:15am.  She states she is not feeling better and started taking Z-Pak this morning.  She reports Tessalon Perles gave her a rash and Delsym is not helping the cough at night.  Wants provider to call in another cough medication.  I will send in Promethazine w/Codiene.  Will also tell patient it could have been the Delsym/Dextromethorphan that caused the rash if she was taking them at same time.  Please call the office if you are not feeling better with this medication.

## 2014-08-17 ENCOUNTER — Encounter (HOSPITAL_COMMUNITY)
Admission: RE | Admit: 2014-08-17 | Discharge: 2014-08-17 | Disposition: A | Discharge status: Home / Self Care | Payer: BC Managed Care – PPO | Source: Ambulatory Visit | Attending: Gastroenterology | Admitting: Gastroenterology

## 2014-08-17 DIAGNOSIS — R1013 Epigastric pain: Secondary | ICD-10-CM | POA: Insufficient documentation

## 2014-08-17 MED ORDER — STERILE WATER FOR INJECTION IJ SOLN
INTRAMUSCULAR | Status: AC
  Filled 2014-08-17: qty 10

## 2014-08-17 MED ORDER — SINCALIDE 5 MCG IJ SOLR
INTRAMUSCULAR | Status: AC
  Filled 2014-08-17: qty 5

## 2014-08-17 MED ORDER — TECHNETIUM TC 99M MEBROFENIN IV KIT
5.0000 | PACK | Freq: Once | INTRAVENOUS | Status: AC | PRN
  Administered 2014-08-17: 5 via INTRAVENOUS

## 2014-08-17 MED ORDER — SINCALIDE 5 MCG IJ SOLR
0.0200 ug/kg | Freq: Once | INTRAMUSCULAR | Status: AC
  Administered 2014-08-17: 1.4 ug via INTRAVENOUS

## 2014-09-13 ENCOUNTER — Other Ambulatory Visit: Payer: Self-pay | Admitting: Gastroenterology

## 2014-09-13 DIAGNOSIS — R1010 Upper abdominal pain, unspecified: Secondary | ICD-10-CM

## 2014-09-13 DIAGNOSIS — R1310 Dysphagia, unspecified: Secondary | ICD-10-CM

## 2014-09-21 ENCOUNTER — Ambulatory Visit
Admission: RE | Admit: 2014-09-21 | Discharge: 2014-09-21 | Disposition: A | Discharge status: Home / Self Care | Payer: BC Managed Care – PPO | Source: Ambulatory Visit | Attending: Gastroenterology | Admitting: Gastroenterology

## 2014-09-21 DIAGNOSIS — R1010 Upper abdominal pain, unspecified: Secondary | ICD-10-CM

## 2014-09-21 DIAGNOSIS — R1310 Dysphagia, unspecified: Secondary | ICD-10-CM

## 2014-09-21 MED ORDER — IOHEXOL 300 MG/ML  SOLN
100.0000 mL | Freq: Once | INTRAMUSCULAR | Status: AC | PRN
  Administered 2014-09-21: 100 mL via INTRAVENOUS

## 2014-12-07 ENCOUNTER — Other Ambulatory Visit (INDEPENDENT_AMBULATORY_CARE_PROVIDER_SITE_OTHER): Payer: Self-pay | Admitting: Surgery

## 2014-12-23 ENCOUNTER — Encounter: Payer: Self-pay | Admitting: *Deleted

## 2014-12-27 HISTORY — PX: CHOLECYSTECTOMY: SHX55

## 2015-01-30 ENCOUNTER — Encounter: Payer: Self-pay | Admitting: Internal Medicine

## 2015-01-30 ENCOUNTER — Ambulatory Visit (INDEPENDENT_AMBULATORY_CARE_PROVIDER_SITE_OTHER): Payer: BC Managed Care – PPO | Admitting: Internal Medicine

## 2015-01-30 VITALS — BP 152/98 | HR 80 | Temp 98.6°F | Resp 18 | Ht 67.0 in | Wt 154.0 lb

## 2015-01-30 DIAGNOSIS — M25532 Pain in left wrist: Secondary | ICD-10-CM

## 2015-01-30 DIAGNOSIS — I10 Essential (primary) hypertension: Secondary | ICD-10-CM

## 2015-01-30 MED ORDER — NABUMETONE 750 MG PO TABS: 750.0000 mg | ORAL_TABLET | Freq: Two times a day (BID) | ORAL | Status: DC

## 2015-01-30 MED ORDER — BISOPROLOL-HYDROCHLOROTHIAZIDE 2.5-6.25 MG PO TABS: 1.0000 | ORAL_TABLET | Freq: Every day | ORAL | Status: DC

## 2015-01-30 NOTE — Patient Instructions (Signed)

## 2015-01-30 NOTE — Progress Notes (Signed)
Subjective:    Patient ID: Isabel Hubbard, female    DOB: 04-25-1991, 24 y.o.   MRN: [id number]  HPI  Patient is a 24 y.o. Female who presents to the office for evaluation of multiple complaints.  She reports that she has been having pain at the site of her IV since her gallbladder surgery on 12/27/14.  She feels like it is breaking her wrist.  She reports that she went to the UC center after the surgery and was diagnosed with phlebitis and was given nabumetone for it and has been having pain since then. She reports that yesterday she developed some pain at the site of one of her incisions and it has continued today.  She also reports high blood pressure for the past several weeks.  She reports that her blood pressure has been in the 140-150s/90's.  She has no history of HTN that she is aware of.  She has completed a course of cephalexin.  She has also recently finished a zpak and amoxicillin.  She does report that she has been using some heat on her hand.    Review of Systems  Constitutional: Positive for fatigue. Negative for fever and chills.  Gastrointestinal: Positive for abdominal pain and constipation. Negative for nausea, vomiting and diarrhea.  Genitourinary: Negative for dysuria, urgency, frequency, hematuria and difficulty urinating.  Musculoskeletal: Positive for joint swelling.       Objective:   Physical Exam  Constitutional: She is oriented to person, place, and time. She appears well-developed and well-nourished. No distress.  HENT:  Head: Normocephalic and atraumatic.  Mouth/Throat: Oropharynx is clear and moist. No oropharyngeal exudate.  Eyes: Conjunctivae and EOM are normal. Pupils are equal, round, and reactive to light. No scleral icterus.  Neck: Normal range of motion. Neck supple. No JVD present. No thyromegaly present.  Cardiovascular: Normal rate, regular rhythm, normal heart sounds and intact distal pulses.  Exam reveals no gallop and no friction rub.   No  murmur heard. Pulmonary/Chest: Effort normal and breath sounds normal. No respiratory distress. She has no wheezes. She has no rales. She exhibits no tenderness.  Abdominal: Soft. Bowel sounds are normal. She exhibits no distension and no mass. There is tenderness. There is no rebound and no guarding.  Well healing surgical laproscopic scars.  No evidence of hernia.  Abdomen appropriately tender post operatively.  Musculoskeletal:       Left wrist: She exhibits decreased range of motion and tenderness. She exhibits no bony tenderness, no swelling, no effusion, no crepitus, no deformity and no laceration.       Left hand: Normal sensation noted. Normal strength noted.       Hands: Lymphadenopathy:    She has no cervical adenopathy.  Neurological: She is alert and oriented to person, place, and time. Coordination normal.  Skin: Skin is warm and dry. She is not diaphoretic.  Psychiatric: She has a normal mood and affect. Her behavior is normal. Judgment and thought content normal.  Nursing note and vitals reviewed.         Assessment & Plan:    1. Left wrist pain -likely phlebitis from IV and stiffness related to wearing wrist brace -cont NSAIDS -Gentle ROM -Heat - nabumetone (RELAFEN) 750 MG tablet; Take 1 tablet (750 mg total) by mouth 2 (two) times daily.  Dispense: 60 tablet; Refill: 0 - DG Wrist Complete Left; Future  2. Essential hypertension, benign -mildly elevated today -likely due to anxiety vs. NSAIDs - bisoprolol-hydrochlorothiazide [facility](ZIAC)  2.5-6.25 MG per tablet; Take 1 tablet by mouth daily.  Dispense: 30 tablet; Refill: 0 -recheck 1 month

## 2015-02-27 ENCOUNTER — Ambulatory Visit (INDEPENDENT_AMBULATORY_CARE_PROVIDER_SITE_OTHER): Payer: BC Managed Care – PPO | Admitting: Internal Medicine

## 2015-02-27 ENCOUNTER — Encounter: Payer: Self-pay | Admitting: Internal Medicine

## 2015-02-27 VITALS — BP 126/74 | HR 68 | Temp 98.4°F | Resp 18 | Ht 67.0 in | Wt 155.0 lb

## 2015-02-27 DIAGNOSIS — F411 Generalized anxiety disorder: Secondary | ICD-10-CM

## 2015-02-27 MED ORDER — SERTRALINE HCL 50 MG PO TABS: ORAL_TABLET | ORAL | Status: DC

## 2015-02-27 MED ORDER — ALPRAZOLAM 0.5 MG PO TABS: 0.5000 mg | ORAL_TABLET | Freq: Three times a day (TID) | ORAL | Status: DC | PRN

## 2015-02-27 NOTE — Patient Instructions (Signed)
Sertraline tablets What is this medicine? SERTRALINE (SER tra leen) is used to treat depression. It may also be used to treat obsessive compulsive disorder, panic disorder, post-trauma stress, premenstrual dysphoric disorder (PMDD) or social anxiety. This medicine may be used for other purposes; ask your health care provider or pharmacist if you have questions. COMMON BRAND NAME(S): Zoloft What should I tell my health care provider before I take this medicine? They need to know if you have any of these conditions: -bipolar disorder or a family history of bipolar disorder -diabetes -glaucoma -heart disease -high blood pressure -history of irregular heartbeat -history of low levels of calcium, magnesium, or potassium in the blood -if you often drink alcohol -liver disease -receiving electroconvulsive therapy -seizures -suicidal thoughts, plans, or attempt; a previous suicide attempt by you or a family member -thyroid disease -an unusual or allergic reaction to sertraline, other medicines, foods, dyes, or preservatives -pregnant or trying to get pregnant -breast-feeding How should I use this medicine? Take this medicine by mouth with a glass of water. Follow the directions on the prescription label. You can take it with or without food. Take your medicine at regular intervals. Do not take your medicine more often than directed. Do not stop taking this medicine suddenly except upon the advice of your doctor. Stopping this medicine too quickly may cause serious side effects or your condition may worsen. A special MedGuide will be given to you by the pharmacist with each prescription and refill. Be sure to read this information carefully each time. Talk to your pediatrician regarding the use of this medicine in children. While this drug may be prescribed for children as young as 7 years for selected conditions, precautions do apply. Overdosage: If you think you have taken too much of this  medicine contact a poison control center or emergency room at once. NOTE: This medicine is only for you. Do not share this medicine with others. What if I miss a dose? If you miss a dose, take it as soon as you can. If it is almost time for your next dose, take only that dose. Do not take double or extra doses. What may interact with this medicine? Do not take this medicine with any of the following medications: -certain medicines for fungal infections like fluconazole, itraconazole, ketoconazole, posaconazole, voriconazole -cisapride -disulfiram -dofetilide -linezolid -MAOIs like Carbex, Eldepryl, Marplan, Nardil, and Parnate -metronidazole -methylene blue (injected into a vein) -pimozide -thioridazine -ziprasidone This medicine may also interact with the following medications: -alcohol -aspirin and aspirin-like medicines -certain medicines for depression, anxiety, or psychotic disturbances -certain medicines for irregular heart beat like flecainide, propafenone -certain medicines for migraine headaches like almotriptan, eletriptan, frovatriptan, naratriptan, rizatriptan, sumatriptan, zolmitriptan -certain medicines for sleep -certain medicines for seizures like carbamazepine, valproic acid, phenytoin -certain medicines that treat or prevent blood clots like warfarin, enoxaparin, dalteparin -cimetidine -digoxin -diuretics -fentanyl -furazolidone -isoniazid -lithium -NSAIDs, medicines for pain and inflammation, like ibuprofen or naproxen -other medicines that prolong the QT interval (cause an abnormal heart rhythm) -procarbazine -rasagiline -supplements like St. John's wort, kava kava, valerian -tolbutamide -tramadol -tryptophan This list may not describe all possible interactions. Give your health care provider a list of all the medicines, herbs, non-prescription drugs, or dietary supplements you use. Also tell them if you smoke, drink alcohol, or use illegal drugs. Some  items may interact with your medicine. What should I watch for while using this medicine? Tell your doctor if your symptoms do not get better or if they get   worse. Visit your doctor or health care professional for regular checks on your progress. Because it may take several weeks to see the full effects of this medicine, it is important to continue your treatment as prescribed by your doctor. Patients and their families should watch out for new or worsening thoughts of suicide or depression. Also watch out for sudden changes in feelings such as feeling anxious, agitated, panicky, irritable, hostile, aggressive, impulsive, severely restless, overly excited and hyperactive, or not being able to sleep. If this happens, especially at the beginning of treatment or after a change in dose, call your health care professional. You may get drowsy or dizzy. Do not drive, use machinery, or do anything that needs mental alertness until you know how this medicine affects you. Do not stand or sit up quickly, especially if you are an older patient. This reduces the risk of dizzy or fainting spells. Alcohol may interfere with the effect of this medicine. Avoid alcoholic drinks. Your mouth may get dry. Chewing sugarless gum or sucking hard candy, and drinking plenty of water may help. Contact your doctor if the problem does not go away or is severe. What side effects may I notice from receiving this medicine? Side effects that you should report to your doctor or health care professional as soon as possible: -allergic reactions like skin rash, itching or hives, swelling of the face, lips, or tongue -black or bloody stools, blood in the urine or vomit -fast, irregular heartbeat -feeling faint or lightheaded, falls -hallucination, loss of contact with reality -seizures -suicidal thoughts or other mood changes -unusual bleeding or bruising -unusually weak or tired -vomiting Side effects that usually do not require  medical attention (report to your doctor or health care professional if they continue or are bothersome): -change in appetite -change in sex drive or performance -diarrhea -increased sweating -indigestion, nausea -tremors This list may not describe all possible side effects. Call your doctor for medical advice about side effects. You may report side effects to FDA at 1-800-FDA-1088. Where should I keep my medicine? Keep out of the reach of children. Store at room temperature between 15 and 30 degrees C (59 and 86 degrees F). Throw away any unused medicine after the expiration date. NOTE: This sheet is a summary. It may not cover all possible information. If you have questions about this medicine, talk to your doctor, pharmacist, or health care provider.  2015, [employer]. (2013-04-18 12:57:35)  

## 2015-02-27 NOTE — Progress Notes (Signed)
   Subjective:    Patient ID: Isabel Hubbard, female    DOB: 02/08/1991, 24 y.o.   MRN: [id number]  HPI  Patient presents to the office for evaluation of blood pressure medicine.  She reports that since that time she has been doing a lot better.  She reports that her blood pressure has not been above 120/80.  Patient reports that it has helped with her anxiety.  She does report that she has a lot of anxiety.  She feels overwhelmed and has cyclical thinking and some difficulty with sleeping.  She is interested in an antidepressant.    Review of Systems  Constitutional: Negative for fever, chills and fatigue.  Respiratory: Negative for cough, chest tightness, shortness of breath and wheezing.   Cardiovascular: Negative for chest pain, palpitations and leg swelling.  Psychiatric/Behavioral: Positive for sleep disturbance and decreased concentration. The patient is nervous/anxious.        Objective:   Physical Exam  Constitutional: She is oriented to person, place, and time. She appears well-developed and well-nourished. No distress.  HENT:  Head: Normocephalic and atraumatic.  Mouth/Throat: Oropharynx is clear and moist. No oropharyngeal exudate.  Eyes: Conjunctivae are normal. No scleral icterus.  Neck: Normal range of motion. Neck supple. No JVD present. No thyromegaly present.  Cardiovascular: Normal rate, regular rhythm, normal heart sounds and intact distal pulses.  Exam reveals no gallop and no friction rub.   No murmur heard. Pulmonary/Chest: Effort normal and breath sounds normal. No respiratory distress. She has no wheezes. She has no rales. She exhibits no tenderness.  Abdominal: Soft. Bowel sounds are normal. She exhibits no distension and no mass. There is no tenderness. There is no rebound and no guarding.  Musculoskeletal: Normal range of motion.  Lymphadenopathy:    She has no cervical adenopathy.  Neurological: She is alert and oriented to person, place, and time.  Skin:  Skin is warm and dry. She is not diaphoretic.  Nursing note and vitals reviewed.         Assessment & Plan:    1. Generalized anxiety disorder  - sertraline (ZOLOFT) 50 MG tablet; Take 1/2 tablet daily with breakfast for 1 week. If no side effects please increase to 1 tablet daily with breakfast.  Dispense: 30 tablet; Refill: 2 - ALPRAZolam (XANAX) 0.5 MG tablet; Take 1 tablet (0.5 mg total) by mouth 3 (three) times daily as needed for sleep or anxiety.  Dispense: 30 tablet; Refill: 0  2. HTN -cut ziac in half with addition of xanax and zoloft. -monitor at home

## 2015-02-28 ENCOUNTER — Other Ambulatory Visit: Payer: Self-pay | Admitting: Internal Medicine

## 2015-04-15 ENCOUNTER — Encounter: Payer: Self-pay | Admitting: Internal Medicine

## 2015-04-15 ENCOUNTER — Ambulatory Visit (INDEPENDENT_AMBULATORY_CARE_PROVIDER_SITE_OTHER): Payer: BC Managed Care – PPO | Admitting: Internal Medicine

## 2015-04-15 VITALS — BP 124/78 | HR 84 | Temp 98.4°F | Resp 16 | Ht 67.0 in | Wt 155.0 lb

## 2015-04-15 DIAGNOSIS — F411 Generalized anxiety disorder: Secondary | ICD-10-CM

## 2015-04-15 MED ORDER — SERTRALINE HCL 50 MG PO TABS: ORAL_TABLET | ORAL | Status: DC

## 2015-04-15 NOTE — Progress Notes (Signed)
Patient ID: Isabel Hubbard, female   DOB: 1991/06/25, 24 y.o.   MRN: [id number]  Assessment and Plan:   1. Generalized anxiety disorder -xanax prn - sertraline (ZOLOFT) 50 MG tablet; Take 1 tablet daily with breakfast.  Dispense: 90 tablet; Refill: 2 -patient to let us know when she wants to get pregnant.     HPI 24 y.o.female presents for 1 month follow up of anxiety and hypertension. Patient reports that they have been doing well.  She reports that she had a hard time with the first week and once she got to a whole tablet she did very well.    female is taking their medication.  They are not having difficulty with their medications.  They report no adverse reactions after the first week.  She does report stopping the blood pressure medication because it has been very good at home.  She has not taken any xanax.  Anxiety has decreased.  Insomnia remains.  She does report no palpitations headaches chest pain or SOB.  Past Medical History  Diagnosis Date  . Allergy      Allergies  Allergen Reactions  . Dexamethasone Rash  . Sulfa Antibiotics Rash  . Naproxen Other (See Comments)    GERD/Reflux  . Prednisone Rash  . Tessalon Perles [Benzonatate] Rash      Current Outpatient Prescriptions on File Prior to Visit  Medication Sig Dispense Refill  . albuterol (VENTOLIN HFA) 108 (90 BASE) MCG/ACT inhaler Inhale 2 puffs into the lungs every 4 (four) hours as needed for wheezing or shortness of breath. 1 Inhaler 1  . ALPRAZolam (XANAX) 0.5 MG tablet Take 1 tablet (0.5 mg total) by mouth 3 (three) times daily as needed for sleep or anxiety. 30 tablet 0  . norethindrone-ethinyl estradiol (JUNEL FE,GILDESS FE,LOESTRIN FE) 1-20 MG-MCG tablet Take 1 tablet by mouth daily.    Marland Kitchen. omeprazole (PRILOSEC) 40 MG capsule Take 40 mg by mouth daily.    . rizatriptan (MAXALT-MLT) 10 MG disintegrating tablet Take 1 tablet (10 mg total) by mouth as needed for migraine. May repeat in 2 hours if needed 10  tablet 0  . sertraline (ZOLOFT) 50 MG tablet Take 1/2 tablet daily with breakfast for 1 week. If no side effects please increase to 1 tablet daily with breakfast. 30 tablet 2   No current facility-administered medications on file prior to visit.    ROS: all negative except above.   Physical Exam: Filed Weights   04/15/15 1604  Weight: 155 lb (70.308 kg)   BP 124/78 mmHg  Pulse 84  Temp(Src) 98.4 F (36.9 C) (Temporal)  Resp 16  Ht 5\' 7"  (1.702 m)  Wt 155 lb (70.308 kg)  BMI 24.27 kg/m2 General Appearance: Well developed well nourished, non-toxic appearing in no apparent distress. Eyes: PERRLA, EOMs, conjunctiva w/ no swelling or erythema or discharge Sinuses: No Frontal/maxillary tenderness ENT/Mouth: Ear canals clear without swelling or erythema.  TM's normal bilaterally with no retractions, bulging, or loss of landmarks.   Neck: Supple, thyroid normal, no notable JVD  Respiratory: Respiratory effort normal, Clear breath sounds anteriorly and posteriorly bilaterally without rales, rhonchi, wheezing or stridor. No retractions or accessory muscle usage. Cardio: RRR with no MRGs.   Abdomen: Soft, + BS.  Non tender, no guarding, rebound, hernias, masses.  Musculoskeletal: Full ROM, 5/5 strength, normal gait.  Skin: Warm, dry without rashes  Neuro: Awake and oriented X 3, Cranial nerves intact. Normal muscle tone, no cerebellar symptoms. Sensation intact.  Psych: normal  affect, Insight and Judgment appropriate.     Terri Piedra, PA-C 4:25 PM [facility]

## 2015-07-29 ENCOUNTER — Other Ambulatory Visit: Payer: Self-pay | Admitting: Internal Medicine

## 2015-10-22 ENCOUNTER — Ambulatory Visit: Payer: Self-pay | Admitting: Internal Medicine

## 2015-10-31 ENCOUNTER — Encounter: Payer: Self-pay | Admitting: Internal Medicine

## 2015-10-31 ENCOUNTER — Ambulatory Visit (INDEPENDENT_AMBULATORY_CARE_PROVIDER_SITE_OTHER): Payer: BC Managed Care – PPO | Admitting: Internal Medicine

## 2015-10-31 VITALS — BP 124/80 | HR 68 | Temp 98.2°F | Resp 18 | Ht 67.0 in | Wt 166.0 lb

## 2015-10-31 DIAGNOSIS — Z3009 Encounter for other general counseling and advice on contraception: Secondary | ICD-10-CM | POA: Diagnosis not present

## 2015-10-31 DIAGNOSIS — I10 Essential (primary) hypertension: Secondary | ICD-10-CM

## 2015-10-31 DIAGNOSIS — F411 Generalized anxiety disorder: Secondary | ICD-10-CM

## 2015-10-31 NOTE — Progress Notes (Signed)
Patient ID: Isabel Hubbard, female   DOB: [birth year], 25 y.o.   MRN: [id number]  Assessment and Plan:  Hypertension:  -monitor blood pressure at home.  -Continue DASH diet.   -Reminder to go to the ER if any CP, SOB, nausea, dizziness, severe HA, changes vision/speech, left arm numbness and tingling, and jaw pain.  Anxiety -resolved currently  Family Planning -recommended daily prenatal vitamin   Continue diet and meds as discussed. Further disposition pending results of labs.  HPI She is here to have recheck.  She is no longer taking any of her anxiety medications or birth control.  She reports that she is feeling great.   She does have the occasional headache.  She is taking a multivitamin.  She is trying to plan a family.    Current Medications:  Current Outpatient Prescriptions on File Prior to Visit  Medication Sig Dispense Refill  . rizatriptan (MAXALT-MLT) 10 MG disintegrating tablet Take 1 tablet (10 mg total) by mouth as needed for migraine. May repeat in 2 hours if needed 10 tablet 0   No current facility-administered medications on file prior to visit.    Medical History:  Past Medical History  Diagnosis Date  . Allergy     Allergies:  Allergies  Allergen Reactions  . Dexamethasone Rash  . Sulfa Antibiotics Rash  . Naproxen Other (See Comments)    GERD/Reflux  . Prednisone Rash  . Tessalon Perles [Benzonatate] Rash     Review of Systems:  Review of Systems  Constitutional: Negative for fever, chills and malaise/fatigue.  HENT: Negative for congestion, ear pain and sore throat.   Eyes: Negative.   Respiratory: Negative for cough, shortness of breath and wheezing.   Cardiovascular: Negative for chest pain, palpitations and leg swelling.  Gastrointestinal: Negative for heartburn, diarrhea, constipation, blood in stool and melena.  Genitourinary: Negative.   Skin: Negative.   Neurological: Negative for dizziness, sensory change, loss of consciousness and  headaches.  Psychiatric/Behavioral: Negative for depression. The patient is not nervous/anxious and does not have insomnia.     Family history- Review and unchanged  Social history- Review and unchanged  Physical Exam: BP 124/80 mmHg  Pulse 68  Temp(Src) 98.2 F (36.8 C) (Temporal)  Resp 18  Ht  (1.702 m)  Wt 166 lb (75.297 kg)  BMI 25.99 kg/m2  LMP 10/28/2015 Wt Readings from Last 3 Encounters:  10/31/15 166 lb (75.297 kg)  04/15/15 155 lb (70.308 kg)  02/27/15 155 lb (70.308 kg)    General Appearance: Well nourished well developed, in no apparent distress. Eyes: PERRLA, EOMs, conjunctiva no swelling or erythema ENT/Mouth: Ear canals normal without obstruction, swelling, erythma, discharge.  TMs normal bilaterally.  Oropharynx moist, clear, without exudate, or postoropharyngeal swelling. Neck: Supple, thyroid normal,no cervical adenopathy  Respiratory: Respiratory effort normal, Breath sounds clear A&P without rhonchi, wheeze, or rale.  No retractions, no accessory usage. Cardio: RRR with no MRGs. Brisk peripheral pulses without edema.  Abdomen: Soft, + BS,  Non tender, no guarding, rebound, hernias, masses. Musculoskeletal: Full ROM, 5/5 strength, Normal gait Skin: Warm, dry without rashes, lesions, ecchymosis.  Neuro: Awake and oriented X 3, Cranial nerves intact. Normal muscle tone, no cerebellar symptoms. Psych: Normal affect, Insight and Judgment appropriate.    Terri Piedra, PA-C 4:53 PM [facility]

## 2016-01-21 ENCOUNTER — Other Ambulatory Visit: Payer: Self-pay | Admitting: Physician Assistant

## 2016-01-30 ENCOUNTER — Telehealth: Payer: Self-pay | Admitting: Internal Medicine

## 2016-01-30 NOTE — Telephone Encounter (Signed)
blood pressure running high for 2 weeks, this morning 160/120. Do you want her to start her blood pressure medicine again and/or anxiety medicine.   FYI: advised to go to urgent care[facility], if neccessary. she scheduled ov for Monday.

## 2016-01-30 NOTE — Telephone Encounter (Signed)
Katrina,  I believe that she has some ziac that she can restart.  Tell her to monitor at home until she comes in on Monday.  Toni Amendourtney

## 2016-02-03 ENCOUNTER — Ambulatory Visit (INDEPENDENT_AMBULATORY_CARE_PROVIDER_SITE_OTHER): Payer: BC Managed Care – PPO | Admitting: Internal Medicine

## 2016-02-03 ENCOUNTER — Encounter: Payer: Self-pay | Admitting: Internal Medicine

## 2016-02-03 ENCOUNTER — Ambulatory Visit: Payer: Self-pay | Admitting: Internal Medicine

## 2016-02-03 VITALS — BP 116/72 | HR 74 | Temp 98.0°F | Resp 18 | Ht 67.0 in | Wt 162.0 lb

## 2016-02-03 DIAGNOSIS — F411 Generalized anxiety disorder: Secondary | ICD-10-CM | POA: Diagnosis not present

## 2016-02-03 MED ORDER — ALPRAZOLAM ER 0.5 MG PO TB24: 0.5000 mg | ORAL_TABLET | Freq: Every day | ORAL | Status: DC

## 2016-02-03 NOTE — Patient Instructions (Signed)
Recommend that you see [facility].  Address: [address]     [place], [place] [address] Phone-[phone]   Dr. Vilinda FlakeEdward Lurey [address], [place], [place] [address]  Phone: [phone]

## 2016-02-03 NOTE — Progress Notes (Signed)
   Subjective:    Patient ID: Isabel Hubbard, female    DOB: 11-22-1990, 25 y.o.   MRN: [id number]  HPI  Patient presents to the office for evaluation of headache and elevated blood pressure.  She reports that she has had really elevated blood pressure last week which happened during testing week last year.  She reports that her blood pressure has come back down to normal since the testing has ended.  She reports that her anxiety is over since her kids did fine on her testing.  She reports that she only has 4 weeks of school left.  She and her husband are still trying to get pregnant.     Review of Systems  Constitutional: Negative for fever, chills and fatigue.  Eyes: Negative for visual disturbance.  Respiratory: Negative for chest tightness and shortness of breath.   Cardiovascular: Negative for chest pain and palpitations.  Gastrointestinal: Negative for nausea, vomiting, diarrhea and constipation.  Neurological: Positive for headaches. Negative for dizziness, weakness and light-headedness.  Psychiatric/Behavioral: Positive for sleep disturbance. Negative for confusion and agitation. The patient is nervous/anxious. The patient is not hyperactive.        Objective:   Physical Exam  Constitutional: She is oriented to person, place, and time. She appears well-developed and well-nourished. No distress.  HENT:  Head: Normocephalic.  Mouth/Throat: Oropharynx is clear and moist. No oropharyngeal exudate.  Eyes: Conjunctivae are normal. No scleral icterus.  Neck: Normal range of motion. Neck supple. No JVD present. No thyromegaly present.  Cardiovascular: Normal rate, regular rhythm, normal heart sounds and intact distal pulses.  Exam reveals no gallop and no friction rub.   No murmur heard. Pulmonary/Chest: Effort normal and breath sounds normal.  Abdominal: Soft. Bowel sounds are normal. She exhibits no distension and no mass. There is no tenderness. There is no rebound and no guarding.   Musculoskeletal: Normal range of motion.  Lymphadenopathy:    She has no cervical adenopathy.  Neurological: She is alert and oriented to person, place, and time.  Skin: Skin is warm and dry. She is not diaphoretic.  Psychiatric: She has a normal mood and affect. Her behavior is normal. Judgment and thought content normal.  Nursing note and vitals reviewed.   Filed Vitals:   02/03/16 1607  BP: 116/72  Pulse: 74  Temp: 98 F (36.7 C)  Resp: 18          Assessment & Plan:    1. Generalized anxiety disorder -xanax xr -referral to counseling -not interested in SSRI right now due to trying to get pregnant. -xanax xr intended only to be used as needed -increase physical activity -benadryl prn for nighttime sleep

## 2016-02-24 ENCOUNTER — Encounter: Payer: Self-pay | Admitting: Internal Medicine

## 2016-02-24 DIAGNOSIS — J209 Acute bronchitis, unspecified: Secondary | ICD-10-CM

## 2016-02-25 MED ORDER — ALBUTEROL SULFATE HFA 108 (90 BASE) MCG/ACT IN AERS
2.0000 | INHALATION_SPRAY | RESPIRATORY_TRACT | Status: DC | PRN
Start: 2016-02-25 — End: 2018-04-06

## 2016-08-12 ENCOUNTER — Encounter: Payer: Self-pay | Admitting: Internal Medicine

## 2016-08-12 ENCOUNTER — Ambulatory Visit (INDEPENDENT_AMBULATORY_CARE_PROVIDER_SITE_OTHER): Payer: BC Managed Care – PPO | Admitting: Internal Medicine

## 2016-08-12 VITALS — BP 110/78 | HR 66 | Temp 98.2°F | Resp 16 | Ht 67.0 in | Wt 160.0 lb

## 2016-08-12 DIAGNOSIS — J4540 Moderate persistent asthma, uncomplicated: Secondary | ICD-10-CM | POA: Diagnosis not present

## 2016-08-12 MED ORDER — MONTELUKAST SODIUM 10 MG PO TABS: 10.0000 mg | ORAL_TABLET | Freq: Every day | ORAL | 2 refills | Status: DC

## 2016-08-12 NOTE — Progress Notes (Signed)
Assessment and Plan:   1. Moderate persistent asthma without complication -add in singulair as trying for pregnancy and is category B -cont albuterol prn -contact us in 1-2 weeks if continued use of albuterol twice daily more than twice per week    HPI 25 y.o.female presents for problems with shortness of breath.  She reports that she recently had bronchitis.  She reports that she has been using it a lot during this school year.  She reports that she is generally using her inhaler 2-3 times per day and this is happening 4 days per week.  She reports that she is aware of when she is wheezing.  She does not have nighttime coughing.  She reports that she has improved since then.  She has never used a maintenance medication.  She typically is experiencing itchy watery eyes and is also has been having some ear congestion.    Past Medical History:  Diagnosis Date  . Allergy      Allergies  Allergen Reactions  . Dexamethasone Rash  . Sulfa Antibiotics Rash  . Naproxen Other (See Comments)    GERD/Reflux  . Prednisone Rash  . Tessalon Perles [Benzonatate] Rash      Current Outpatient Prescriptions on File Prior to Visit  Medication Sig Dispense Refill  . albuterol (VENTOLIN HFA) 108 (90 Base) MCG/ACT inhaler Inhale 2 puffs into the lungs every 4 (four) hours as needed for wheezing or shortness of breath. 1 Inhaler 1  . rizatriptan (MAXALT-MLT) 10 MG disintegrating tablet DISSOLVE ONE TABLET IN MOUTH AS NEEDED FOR MIGRAINE. MAY REPEAT IN 2 HOURS AS NEEDED. 10 tablet 0   No current facility-administered medications on file prior to visit.     ROS: all negative except above.   Physical Exam: Filed Weights   08/12/16 1621  Weight: 160 lb (72.6 kg)   BP 110/78   Pulse 66   Temp 98.2 F (36.8 C) (Temporal)   Resp 16   Ht 5\' 7"  (1.702 m)   Wt 160 lb (72.6 kg)   LMP 07/28/2016   BMI 25.06 kg/m  General Appearance: Well developed well nourished, non-toxic appearing in no apparent  distress. Eyes: PERRLA, EOMs, conjunctiva w/ no swelling or erythema or discharge Sinuses: No Frontal/maxillary tenderness ENT/Mouth: Ear canals clear without swelling or erythema.  TM's normal bilaterally with no retractions, bulging, or loss of landmarks.   Neck: Supple, thyroid normal, no notable JVD  Respiratory: Respiratory effort normal, Clear breath sounds anteriorly and posteriorly bilaterally without rales, rhonchi, wheezing or stridor. No retractions or accessory muscle usage. Cardio: RRR with no MRGs.   Abdomen: Soft, + BS.  Non tender, no guarding, rebound, hernias, masses.  Musculoskeletal: Full ROM, 5/5 strength, normal gait.  Skin: Warm, dry without rashes  Neuro: Awake and oriented X 3, Cranial nerves intact. Normal muscle tone, no cerebellar symptoms. Sensation intact.  Psych: normal affect, Insight and Judgment appropriate.     Terri Piedraourtney Forcucci, PA-C 4:28 PM [facility]

## 2016-08-31 ENCOUNTER — Other Ambulatory Visit: Payer: Self-pay | Admitting: Internal Medicine

## 2016-09-01 ENCOUNTER — Encounter: Payer: Self-pay | Admitting: Physician Assistant

## 2016-09-01 ENCOUNTER — Ambulatory Visit (INDEPENDENT_AMBULATORY_CARE_PROVIDER_SITE_OTHER): Payer: BC Managed Care – PPO | Admitting: Physician Assistant

## 2016-09-01 VITALS — BP 122/70 | HR 86 | Resp 16 | Ht 67.0 in | Wt 156.6 lb

## 2016-09-01 DIAGNOSIS — J4540 Moderate persistent asthma, uncomplicated: Secondary | ICD-10-CM

## 2016-09-01 DIAGNOSIS — Z79899 Other long term (current) drug therapy: Secondary | ICD-10-CM | POA: Diagnosis not present

## 2016-09-01 DIAGNOSIS — Z0000 Encounter for general adult medical examination without abnormal findings: Secondary | ICD-10-CM

## 2016-09-01 DIAGNOSIS — E559 Vitamin D deficiency, unspecified: Secondary | ICD-10-CM | POA: Diagnosis not present

## 2016-09-01 DIAGNOSIS — Z1389 Encounter for screening for other disorder: Secondary | ICD-10-CM

## 2016-09-01 DIAGNOSIS — F411 Generalized anxiety disorder: Secondary | ICD-10-CM

## 2016-09-01 DIAGNOSIS — I10 Essential (primary) hypertension: Secondary | ICD-10-CM

## 2016-09-01 DIAGNOSIS — Z13220 Encounter for screening for lipoid disorders: Secondary | ICD-10-CM

## 2016-09-01 DIAGNOSIS — Z131 Encounter for screening for diabetes mellitus: Secondary | ICD-10-CM

## 2016-09-01 LAB — CBC WITH DIFFERENTIAL/PLATELET
BASOS PCT: 0 %
Basophils Absolute: 0 cells/uL (ref 0–200)
EOS PCT: 1 %
Eosinophils Absolute: 74 cells/uL (ref 15–500)
HCT: 40.5 % (ref 35.0–45.0)
Hemoglobin: 13.6 g/dL (ref 11.7–15.5)
Lymphocytes Relative: 26 %
Lymphs Abs: 1924 cells/uL (ref 850–3900)
MCH: 31 pg (ref 27.0–33.0)
MCHC: 33.6 g/dL (ref 32.0–36.0)
MCV: 92.3 fL (ref 80.0–100.0)
MONOS PCT: 8 %
MPV: 9 fL (ref 7.5–12.5)
Monocytes Absolute: 592 cells/uL (ref 200–950)
Neutro Abs: 4810 cells/uL (ref 1500–7800)
Neutrophils Relative %: 65 %
PLATELETS: 245 10*3/uL (ref 140–400)
RBC: 4.39 MIL/uL (ref 3.80–5.10)
RDW: 13.3 % (ref 11.0–15.0)
WBC: 7.4 10*3/uL (ref 3.8–10.8)

## 2016-09-01 NOTE — Patient Instructions (Addendum)
Try the melatonin '5mg'$ -'20mg'$  dissolvable or gummy 30 mins before bed  Preventive Care for Adults A healthy lifestyle and preventive care can promote health and wellness. Preventive health guidelines for women include the following key practices.  A routine yearly physical is a good way to check with your health care provider about your health and preventive screening. It is a chance to share any concerns and updates on your health and to receive a thorough exam.  Visit your dentist for a routine exam and preventive care every 6 months. Brush your teeth twice a day and floss once a day. Good oral hygiene prevents tooth decay and gum disease.  The frequency of eye exams is based on your age, health, family medical history, use of contact lenses, and other factors. Follow your health care provider's recommendations for frequency of eye exams.  Eat a healthy diet. Foods like vegetables, fruits, whole grains, low-fat dairy products, and lean protein foods contain the nutrients you need without too many calories. Decrease your intake of foods high in solid fats, added sugars, and salt. Eat the right amount of calories for you.Get information about a proper diet from your health care provider, if necessary.  Regular physical exercise is one of the most important things you can do for your health. Most adults should get at least 150 minutes of moderate-intensity exercise (any activity that increases your heart rate and causes you to sweat) each week. In addition, most adults need muscle-strengthening exercises on 2 or more days a week.  Maintain a healthy weight. The body mass index (BMI) is a screening tool to identify possible weight problems. It provides an estimate of body fat based on height and weight. Your health care provider can find your BMI and can help you achieve or maintain a healthy weight.For adults 20 years and older:  A BMI below 18.5 is considered underweight.  A BMI of 18.5 to 24.9 is  normal.  A BMI of 25 to 29.9 is considered overweight.  A BMI of 30 and above is considered obese.  Maintain normal blood lipids and cholesterol levels by exercising and minimizing your intake of saturated fat. Eat a balanced diet with plenty of fruit and vegetables. Blood tests for lipids and cholesterol should begin at age 26 and be repeated every 5 years. If your lipid or cholesterol levels are high, you are over 50, or you are at high risk for heart disease, you may need your cholesterol levels checked more frequently.Ongoing high lipid and cholesterol levels should be treated with medicines if diet and exercise are not working.  If you smoke, find out from your health care provider how to quit. If you do not use tobacco, do not start.  Lung cancer screening is recommended for adults aged 85-80 years who are at high risk for developing lung cancer because of a history of smoking. A yearly low-dose CT scan of the lungs is recommended for people who have at least a 30-pack-year history of smoking and are a current smoker or have quit within the past 15 years. A pack year of smoking is smoking an average of 1 pack of cigarettes a day for 1 year (for example: 1 pack a day for 30 years or 2 packs a day for 15 years). Yearly screening should continue until the smoker has stopped smoking for at least 15 years. Yearly screening should be stopped for people who develop a health problem that would prevent them from having lung cancer treatment.  If you are pregnant, do not drink alcohol. If you are breastfeeding, be very cautious about drinking alcohol. If you are not pregnant and choose to drink alcohol, do not have more than 1 drink per day. One drink is considered to be 12 ounces (355 mL) of beer, 5 ounces (148 mL) of wine, or 1.5 ounces (44 mL) of liquor.  Avoid use of street drugs. Do not share needles with anyone. Ask for help if you need support or instructions about stopping the use of  drugs.  High blood pressure causes heart disease and increases the risk of stroke. Your blood pressure should be checked at least every 1 to 2 years. Ongoing high blood pressure should be treated with medicines if weight loss and exercise do not work.  If you are 55-79 years old, ask your health care provider if you should take aspirin to prevent strokes.  Diabetes screening involves taking a blood sample to check your fasting blood sugar level. This should be done once every 3 years, after age 45, if you are within normal weight and without risk factors for diabetes. Testing should be considered at a younger age or be carried out more frequently if you are overweight and have at least 1 risk factor for diabetes.  Breast cancer screening is essential preventive care for women. You should practice "breast self-awareness." This means understanding the normal appearance and feel of your breasts and may include breast self-examination. Any changes detected, no matter how small, should be reported to a health care provider. Women in their 20s and 30s should have a clinical breast exam (CBE) by a health care provider as part of a regular health exam every 1 to 3 years. After age 40, women should have a CBE every year. Starting at age 40, women should consider having a mammogram (breast X-ray test) every year. Women who have a family history of breast cancer should talk to their health care provider about genetic screening. Women at a high risk of breast cancer should talk to their health care providers about having an MRI and a mammogram every year.  Breast cancer gene (BRCA)-related cancer risk assessment is recommended for women who have family members with BRCA-related cancers. BRCA-related cancers include breast, ovarian, tubal, and peritoneal cancers. Having family members with these cancers may be associated with an increased risk for harmful changes (mutations) in the breast cancer genes BRCA1 and BRCA2.  Results of the assessment will determine the need for genetic counseling and BRCA1 and BRCA2 testing.  Routine pelvic exams to screen for cancer are no longer recommended for nonpregnant women who are considered low risk for cancer of the pelvic organs (ovaries, uterus, and vagina) and who do not have symptoms. Ask your health care provider if a screening pelvic exam is right for you.  If you have had past treatment for cervical cancer or a condition that could lead to cancer, you need Pap tests and screening for cancer for at least 20 years after your treatment. If Pap tests have been discontinued, your risk factors (such as having a new sexual partner) need to be reassessed to determine if screening should be resumed. Some women have medical problems that increase the chance of getting cervical cancer. In these cases, your health care provider may recommend more frequent screening and Pap tests.  The HPV test is an additional test that may be used for cervical cancer screening. The HPV test looks for the virus that can cause the cell changes   on the cervix. The cells collected during the Pap test can be tested for HPV. The HPV test could be used to screen women aged 30 years and older, and should be used in women of any age who have unclear Pap test results. After the age of 30, women should have HPV testing at the same frequency as a Pap test.  Colorectal cancer can be detected and often prevented. Most routine colorectal cancer screening begins at the age of 50 years and continues through age 75 years. However, your health care provider may recommend screening at an earlier age if you have risk factors for colon cancer. On a yearly basis, your health care provider may provide home test kits to check for hidden blood in the stool. Use of a small camera at the end of a tube, to directly examine the colon (sigmoidoscopy or colonoscopy), can detect the earliest forms of colorectal cancer. Talk to your health  care provider about this at age 50, when routine screening begins. Direct exam of the colon should be repeated every 5-10 years through age 75 years, unless early forms of pre-cancerous polyps or small growths are found.  People who are at an increased risk for hepatitis B should be screened for this virus. You are considered at high risk for hepatitis B if:  You were born in a country where hepatitis B occurs often. Talk with your health care provider about which countries are considered high risk.  Your parents were born in a high-risk country and you have not received a shot to protect against hepatitis B (hepatitis B vaccine).  You have HIV or AIDS.  You use needles to inject street drugs.  You live with, or have sex with, someone who has hepatitis B.  You get hemodialysis treatment.  You take certain medicines for conditions like cancer, organ transplantation, and autoimmune conditions.  Hepatitis C blood testing is recommended for all people born from 1945 through 1965 and any individual with known risks for hepatitis C.  Practice safe sex. Use condoms and avoid high-risk sexual practices to reduce the spread of sexually transmitted infections (STIs). STIs include gonorrhea, chlamydia, syphilis, trichomonas, herpes, HPV, and human immunodeficiency virus (HIV). Herpes, HIV, and HPV are viral illnesses that have no cure. They can result in disability, cancer, and death.  You should be screened for sexually transmitted illnesses (STIs) including gonorrhea and chlamydia if:  You are sexually active and are younger than 24 years.  You are older than 24 years and your health care provider tells you that you are at risk for this type of infection.  Your sexual activity has changed since you were last screened and you are at an increased risk for chlamydia or gonorrhea. Ask your health care provider if you are at risk.  If you are at risk of being infected with HIV, it is recommended  that you take a prescription medicine daily to prevent HIV infection. This is called preexposure prophylaxis (PrEP). You are considered at risk if:  You are a heterosexual woman, are sexually active, and are at increased risk for HIV infection.  You take drugs by injection.  You are sexually active with a partner who has HIV.  Talk with your health care provider about whether you are at high risk of being infected with HIV. If you choose to begin PrEP, you should first be tested for HIV. You should then be tested every 3 months for as long as you are taking PrEP.    Osteoporosis is a disease in which the bones lose minerals and strength with aging. This can result in serious bone fractures or breaks. The risk of osteoporosis can be identified using a bone density scan. Women ages 23 years and over and women at risk for fractures or osteoporosis should discuss screening with their health care providers. Ask your health care provider whether you should take a calcium supplement or vitamin D to reduce the rate of osteoporosis.  Menopause can be associated with physical symptoms and risks. Hormone replacement therapy is available to decrease symptoms and risks. You should talk to your health care provider about whether hormone replacement therapy is right for you.  Use sunscreen. Apply sunscreen liberally and repeatedly throughout the day. You should seek shade when your shadow is shorter than you. Protect yourself by wearing long sleeves, pants, a wide-brimmed hat, and sunglasses year round, whenever you are outdoors.  Once a month, do a whole body skin exam, using a mirror to look at the skin on your back. Tell your health care provider of new moles, moles that have irregular borders, moles that are larger than a pencil eraser, or moles that have changed in shape or color.  Stay current with required vaccines (immunizations).  Influenza vaccine. All adults should be immunized every year.  Tetanus,  diphtheria, and acellular pertussis (Td, Tdap) vaccine. Pregnant women should receive 1 dose of Tdap vaccine during each pregnancy. The dose should be obtained regardless of the length of time since the last dose. Immunization is preferred during the 27th-36th week of gestation. An adult who has not previously received Tdap or who does not know her vaccine status should receive 1 dose of Tdap. This initial dose should be followed by tetanus and diphtheria toxoids (Td) booster doses every 10 years. Adults with an unknown or incomplete history of completing a 3-dose immunization series with Td-containing vaccines should begin or complete a primary immunization series including a Tdap dose. Adults should receive a Td booster every 10 years.  Varicella vaccine. An adult without evidence of immunity to varicella should receive 2 doses or a second dose if she has previously received 1 dose. Pregnant females who do not have evidence of immunity should receive the first dose after pregnancy. This first dose should be obtained before leaving the health care facility. The second dose should be obtained 4-8 weeks after the first dose.  Human papillomavirus (HPV) vaccine. Females aged 13-26 years who have not received the vaccine previously should obtain the 3-dose series. The vaccine is not recommended for use in pregnant females. However, pregnancy testing is not needed before receiving a dose. If a female is found to be pregnant after receiving a dose, no treatment is needed. In that case, the remaining doses should be delayed until after the pregnancy. Immunization is recommended for any person with an immunocompromised condition through the age of 34 years if she did not get any or all doses earlier. During the 3-dose series, the second dose should be obtained 4-8 weeks after the first dose. The third dose should be obtained 24 weeks after the first dose and 16 weeks after the second dose.  Zoster vaccine. One dose  is recommended for adults aged 49 years or older unless certain conditions are present.  Measles, mumps, and rubella (MMR) vaccine. Adults born before [birth year] generally are considered immune to measles and mumps. Adults born in [birth year] or later should have 1 or more doses of MMR vaccine unless there is a  contraindication to the vaccine or there is laboratory evidence of immunity to each of the three diseases. A routine second dose of MMR vaccine should be obtained at least 28 days after the first dose for students attending postsecondary schools, health care workers, or international travelers. People who received inactivated measles vaccine or an unknown type of measles vaccine during 1963-1967 should receive 2 doses of MMR vaccine. People who received inactivated mumps vaccine or an unknown type of mumps vaccine before 1979 and are at high risk for mumps infection should consider immunization with 2 doses of MMR vaccine. For females of childbearing age, rubella immunity should be determined. If there is no evidence of immunity, females who are not pregnant should be vaccinated. If there is no evidence of immunity, females who are pregnant should delay immunization until after pregnancy. Unvaccinated health care workers born before [birth year] who lack laboratory evidence of measles, mumps, or rubella immunity or laboratory confirmation of disease should consider measles and mumps immunization with 2 doses of MMR vaccine or rubella immunization with 1 dose of MMR vaccine.  Pneumococcal 13-valent conjugate (PCV13) vaccine. When indicated, a person who is uncertain of her immunization history and has no record of immunization should receive the PCV13 vaccine. An adult aged 72 years or older who has certain medical conditions and has not been previously immunized should receive 1 dose of PCV13 vaccine. This PCV13 should be followed with a dose of pneumococcal polysaccharide (PPSV23) vaccine. The PPSV23 vaccine dose should be  obtained at least 8 weeks after the dose of PCV13 vaccine. An adult aged 33 years or older who has certain medical conditions and previously received 1 or more doses of PPSV23 vaccine should receive 1 dose of PCV13. The PCV13 vaccine dose should be obtained 1 or more years after the last PPSV23 vaccine dose.  Pneumococcal polysaccharide (PPSV23) vaccine. When PCV13 is also indicated, PCV13 should be obtained first. All adults aged 50 years and older should be immunized. An adult younger than age 14 years who has certain medical conditions should be immunized. Any person who resides in a nursing home or long-term care facility should be immunized. An adult smoker should be immunized. People with an immunocompromised condition and certain other conditions should receive both PCV13 and PPSV23 vaccines. People with human immunodeficiency virus (HIV) infection should be immunized as soon as possible after diagnosis. Immunization during chemotherapy or radiation therapy should be avoided. Routine use of PPSV23 vaccine is not recommended for American Indians, [place]atives, or people younger than 65 years unless there are medical conditions that require PPSV23 vaccine. When indicated, people who have unknown immunization and have no record of immunization should receive PPSV23 vaccine. One-time revaccination 5 years after the first dose of PPSV23 is recommended for people aged 19-64 years who have chronic kidney failure, nephrotic syndrome, asplenia, or immunocompromised conditions. People who received 1-2 doses of PPSV23 before age 39 years should receive another dose of PPSV23 vaccine at age 43 years or later if at least 5 years have passed since the previous dose. Doses of PPSV23 are not needed for people immunized with PPSV23 at or after age 74 years.  Meningococcal vaccine. Adults with asplenia or persistent complement component deficiencies should receive 2 doses of quadrivalent meningococcal conjugate  (MenACWY-D) vaccine. The doses should be obtained at least 2 months apart. Microbiologists working with certain meningococcal bacteria, [employer] recruits, people at risk during an outbreak, and people who travel to or live in countries with a high rate  of meningitis should be immunized. A first-year college student up through age 28 years who is living in a residence hall should receive a dose if she did not receive a dose on or after her 16th birthday. Adults who have certain high-risk conditions should receive one or more doses of vaccine.  Hepatitis A vaccine. Adults who wish to be protected from this disease, have certain high-risk conditions, work with hepatitis A-infected animals, work in hepatitis A research labs, or travel to or work in countries with a high rate of hepatitis A should be immunized. Adults who were previously unvaccinated and who anticipate close contact with an international adoptee during the first 60 days after arrival in the [place] from a country with a high rate of hepatitis A should be immunized.  Hepatitis B vaccine. Adults who wish to be protected from this disease, have certain high-risk conditions, may be exposed to blood or other infectious body fluids, are household contacts or sex partners of hepatitis B positive people, are clients or workers in certain care facilities, or travel to or work in countries with a high rate of hepatitis B should be immunized.  Haemophilus influenzae type b (Hib) vaccine. A previously unvaccinated person with asplenia or sickle cell disease or having a scheduled splenectomy should receive 1 dose of Hib vaccine. Regardless of previous immunization, a recipient of a hematopoietic stem cell transplant should receive a 3-dose series 6-12 months after her successful transplant. Hib vaccine is not recommended for adults with HIV infection. Preventive Services / Frequency  Ages 86 to 56 years 1. Blood pressure check. 2. Lipid and  cholesterol check. 3. Clinical breast exam.** / Every 3 years for women in their 84s and 6s. 4. BRCA-related cancer risk assessment.** / For women who have family members with a BRCA-related cancer (breast, ovarian, tubal, or peritoneal cancers). 5. Pap test.** / Every 2 years from ages 79 through 44. Every 3 years starting at age 33 through age 43 or 34 with a history of 3 consecutive normal Pap tests. 6. HPV screening.** / Every 3 years from ages 31 through ages 69 to 39 with a history of 3 consecutive normal Pap tests. 7. Hepatitis C blood test.** / For any individual with known risks for hepatitis C. 8. Skin self-exam. / Monthly. 9. Influenza vaccine. / Every year. 10. Tetanus, diphtheria, and acellular pertussis (Tdap, Td) vaccine.** / Consult your health care provider. Pregnant women should receive 1 dose of Tdap vaccine during each pregnancy. 1 dose of Td every 10 years. 11. Varicella vaccine.** / Consult your health care provider. Pregnant females who do not have evidence of immunity should receive the first dose after pregnancy. 12. HPV vaccine. / 3 doses over 6 months, if 50 and younger. The vaccine is not recommended for use in pregnant females. However, pregnancy testing is not needed before receiving a dose. 13. Measles, mumps, rubella (MMR) vaccine.** / You need at least 1 dose of MMR if you were born in 1957 or later. You may also need a 2nd dose. For females of childbearing age, rubella immunity should be determined. If there is no evidence of immunity, females who are not pregnant should be vaccinated. If there is no evidence of immunity, females who are pregnant should delay immunization until after pregnancy. 14. Pneumococcal 13-valent conjugate (PCV13) vaccine.** / Consult your health care provider. 15. Pneumococcal polysaccharide (PPSV23) vaccine.** / 1 to 2 doses if you smoke cigarettes or if you have certain conditions. 16. Meningococcal vaccine.** /  1 dose if you are age 57 to  2 years and a Market researcher living in a residence hall, or have one of several medical conditions, you need to get vaccinated against meningococcal disease. You may also need additional booster doses. 17. Hepatitis A vaccine.** / Consult your health care provider. 18. Hepatitis B vaccine.** / Consult your health care provider. 19. Haemophilus influenzae type b (Hib) vaccine.** / Consult your health care provider.  1. Chlamydia, HIV, and other sexually transmitted diseases- Get screened every year until age 20, then within three months of each new sexual provider. 21. Pap Smear- Every 1-3 years; discuss with your health care provider. 65. Mammogram- Every year starting at age 86  Take these steps 1. Do not smoke-Your healthcare provider can help you quit.  For tips on how to quit go to www.smokefree.gov or call 1-800 QUITNOW. 2. Be physically active- Exercise 5 days a week for at least 30 minutes.  If you are not already physically active, start slow and gradually work up to 30 minutes of moderate physical activity.  Examples of moderate activity include walking briskly, dancing, swimming, bicycling, etc. 3. Breast Cancer- A self breast exam every month is important for early detection of breast cancer.  For more information and instruction on self breast exams, ask your healthcare provider or https://www.patel.info/. 4. Eat a healthy diet- Eat a variety of healthy foods such as fruits, vegetables, whole grains, low fat milk, low fat cheeses, yogurt, lean meats, poultry and fish, beans, nuts, tofu, etc.  For more information go to www. Thenutritionsource.org 5. Drink alcohol in moderation- Limit alcohol intake to one drink or less per day. Never drink and drive. 6. Depression- Your emotional health is as important as your physical health.  If you're feeling down or losing interest in things you normally enjoy please talk to your healthcare provider about being  screened for depression. 7. Dental visit- Brush and floss your teeth twice daily; visit your dentist twice a year. 8. Eye doctor- Get an eye exam at least every 2 years. 9. Helmet use- Always wear a helmet when riding a bicycle, motorcycle, rollerblading or skateboarding. 6. Safe sex- If you may be exposed to sexually transmitted infections, use a condom. 11. Seat belts- Seat belts can save your live; always wear one. 12. Smoke/Carbon Monoxide detectors- These detectors need to be installed on the appropriate level of your home. Replace batteries at least once a year. 13. Skin cancer- When out in the sun please cover up and use sunscreen 15 SPF or higher. 14. Violence- If anyone is threatening or hurting you, please tell your healthcare provider.

## 2016-09-01 NOTE — Progress Notes (Signed)
Complete Physical  Assessment and Plan:  Essential hypertension, benign -     CBC with Differential/Platelet -     BASIC METABOLIC PANEL WITH GFR -     Hepatic function panel -     TSH  Generalized anxiety disorder  Moderate persistent asthma without complication  Screening for diabetes mellitus -     Hemoglobin A1c  Screening cholesterol level -     Lipid panel  Vitamin D deficiency -     VITAMIN D 25 Hydroxy (Vit-D Deficiency, Fractures)  Medication management -     Magnesium  Routine general medical examination at a health care facility -     CBC with Differential/Platelet -     BASIC METABOLIC PANEL WITH GFR -     Hepatic function panel -     TSH -     Lipid panel -     Magnesium -     VITAMIN D 25 Hydroxy (Vit-D Deficiency, Fractures) -     Urinalysis, Routine w reflex microscopic (not [facility]) -     Microalbumin / creatinine urine ratio -     Hemoglobin A1c  Screening for blood or protein in urine -     Urinalysis, Routine w reflex microscopic (not [facility]) -     Microalbumin / creatinine urine ratio  . Discussed med's effects and SE's. Screening labs and tests as requested with regular follow-up as recommended. Over 40 minutes of exam, counseling, chart review and critical decision making was performed  HPI  This very nice 25 y.o.female presents for complete physical.  Patient has no major health issues.  Patient reports no complaints at this time.  She does workout.  She is following with Dr. 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  Finally, patient has history of Vitamin D Deficiency . Currently on supplementation.  She states anxiety is doing well, stopped zoloft in march and has not been on xanax in July.   Current Medications:  Current Outpatient Prescriptions on File Prior to Visit  Medication Sig Dispense Refill  . albuterol (VENTOLIN HFA) 108 (90 Base) MCG/ACT inhaler Inhale 2 puffs into the  lungs every 4 (four) hours as needed for wheezing or shortness of breath. 1 Inhaler 1  . montelukast (SINGULAIR) 10 MG tablet TAKE 1 TABLET (10 MG TOTAL) BY MOUTH DAILY. 30 tablet 2  . rizatriptan (MAXALT-MLT) 10 MG disintegrating tablet DISSOLVE ONE TABLET IN MOUTH AS NEEDED FOR MIGRAINE. MAY REPEAT IN 2 HOURS AS NEEDED. 10 tablet 0   No current facility-administered medications on file prior to visit.    Health Maintenance:   Immunization History  Administered Date(s) Administered  . Tdap 01/11/2013   TD/TDAP: 2014 Influenza: going to get at [employer] Pneumovax: N/a Prevnar 13: N/a  LMP: Patient's last menstrual period was 08/29/2016. Sexually Active: yes  Pap:  August 2017, never abnormal MGM: : N/A  Allergies:  Allergies  Allergen Reactions  . Dexamethasone Rash  . Sulfa Antibiotics Rash  . Naproxen Other (See Comments)    GERD/Reflux  . Prednisone Rash  . Tessalon Perles [Benzonatate] Rash   Medical History:  has Asthma on her problem list. Surgical History:  She  has a past surgical history that includes Appendectomy and Cholecystectomy (12/27/14). Family History:  Her family history includes COPD in her paternal grandfather; Cancer in her paternal grandmother; Cancer (age of onset: 6849) in her mother; Cancer (age of onset: 7666) in  her maternal grandmother; Hyperlipidemia in her father, paternal grandfather, and paternal grandmother; Stroke in her maternal grandfather. Social History:   reports that she has never smoked. She has never used smokeless tobacco. She reports that she drinks alcohol. She reports that she does not use drugs.  Review of Systems: Review of Systems  Constitutional: Negative.   HENT: Negative.   Eyes: Negative.   Respiratory: Negative.   Cardiovascular: Negative.   Gastrointestinal: Negative.   Genitourinary: Negative.   Musculoskeletal: Negative.   Skin: Negative.   Neurological: Negative.   Endo/Heme/Allergies: Negative.    Psychiatric/Behavioral: Negative.     Physical Exam: Estimated body mass index is 24.53 kg/m as calculated from the following:   Height as of this encounter: 5\' 7"  (1.702 m).   Weight as of this encounter: 156 lb 9.6 oz (71 kg). BP 122/70   Pulse 86   Resp 16   Ht 5\' 7"  (1.702 m)   Wt 156 lb 9.6 oz (71 kg)   LMP 08/29/2016   SpO2 98%   BMI 24.53 kg/m  General Appearance: Well nourished, in no apparent distress.  Eyes: PERRLA, EOMs, conjunctiva no swelling or erythema, normal fundi and vessels.  Sinuses: No Frontal/maxillary tenderness  ENT/Mouth: Ext aud canals clear, normal light reflex with TMs without erythema, bulging. Good dentition. No erythema, swelling, or exudate on post pharynx. Tonsils not swollen or erythematous. Hearing normal.  Neck: Supple, thyroid normal. No bruits  Respiratory: Respiratory effort normal, BS equal bilaterally without rales, rhonchi, wheezing or stridor.  Cardio: RRR without murmurs, rubs or gallops. Brisk peripheral pulses without edema.  Chest: symmetric, with normal excursions and percussion.  Breasts: Symmetric, without lumps, nipple discharge, retractions.  Abdomen: Soft, nontender, no guarding, rebound, hernias, masses, or organomegaly.  Lymphatics: Non tender without lymphadenopathy.  Genitourinary:  Musculoskeletal: Full ROM all peripheral extremities,5/5 strength, and normal gait.  Skin: Warm, dry without rashes, lesions, ecchymosis. Neuro: Cranial nerves intact, reflexes equal bilaterally. Normal muscle tone, no cerebellar symptoms. Sensation intact.  Psych: Awake and oriented X 3, normal affect, Insight and Judgment appropriate.   EKG: defer  Quentin MullingAmanda Yaritzy Huser 3:24 PM [facility]

## 2016-09-02 LAB — HEMOGLOBIN A1C
HEMOGLOBIN A1C: 5 % (ref <5.7)
Mean Plasma Glucose: 97 mg/dL

## 2016-09-02 LAB — URINALYSIS, ROUTINE W REFLEX MICROSCOPIC
Bilirubin Urine: NEGATIVE
Glucose, UA: NEGATIVE
Ketones, ur: NEGATIVE
Leukocytes, UA: NEGATIVE
Nitrite: NEGATIVE
Protein, ur: NEGATIVE
Specific Gravity, Urine: 1.026 (ref 1.001–1.035)
pH: 6 (ref 5.0–8.0)

## 2016-09-02 LAB — BASIC METABOLIC PANEL WITHOUT GFR
BUN: 11 mg/dL (ref 7–25)
CO2: 29 mmol/L (ref 20–31)
Calcium: 9.5 mg/dL (ref 8.6–10.2)
Chloride: 102 mmol/L (ref 98–110)
Creat: 0.65 mg/dL (ref 0.50–1.10)
GFR, Est African American: >89 mL/min
GFR, Est Non African American: >89 mL/min
Glucose, Bld: 76 mg/dL (ref 65–99)
Potassium: 3.8 mmol/L (ref 3.5–5.3)
Sodium: 140 mmol/L (ref 135–146)

## 2016-09-02 LAB — LIPID PANEL
Cholesterol: 195 mg/dL
HDL: 63 mg/dL
LDL Cholesterol: 104 mg/dL — ABNORMAL HIGH
Total CHOL/HDL Ratio: 3.1 ratio
Triglycerides: 139 mg/dL
VLDL: 28 mg/dL

## 2016-09-02 LAB — MAGNESIUM: Magnesium: 2 mg/dL (ref 1.5–2.5)

## 2016-09-02 LAB — MICROALBUMIN / CREATININE URINE RATIO
CREATININE, URINE: 267 mg/dL (ref 20–320)
Microalb Creat Ratio: 5 mcg/mg creat (ref <30)
Microalb, Ur: 1.3 mg/dL

## 2016-09-02 LAB — HEPATIC FUNCTION PANEL
ALT: 9 U/L (ref 6–29)
AST: 12 U/L (ref 10–30)
Albumin: 4.2 g/dL (ref 3.6–5.1)
Alkaline Phosphatase: 55 U/L (ref 33–115)
Bilirubin, Direct: 0.1 mg/dL
Indirect Bilirubin: 0.2 mg/dL (ref 0.2–1.2)
Total Bilirubin: 0.3 mg/dL (ref 0.2–1.2)
Total Protein: 7 g/dL (ref 6.1–8.1)

## 2016-09-02 LAB — URINALYSIS, MICROSCOPIC ONLY
Bacteria, UA: NONE SEEN [HPF]
Casts: NONE SEEN [LPF]
WBC, UA: NONE SEEN WBC/HPF
Yeast: NONE SEEN [HPF]

## 2016-09-02 LAB — VITAMIN D 25 HYDROXY (VIT D DEFICIENCY, FRACTURES): Vit D, 25-Hydroxy: 23 ng/mL — ABNORMAL LOW (ref 30–100)

## 2016-09-02 LAB — TSH: TSH: 1.53 m[IU]/L

## 2016-11-10 ENCOUNTER — Ambulatory Visit (INDEPENDENT_AMBULATORY_CARE_PROVIDER_SITE_OTHER): Payer: BC Managed Care – PPO | Admitting: Internal Medicine

## 2016-11-10 VITALS — BP 114/80 | HR 76 | Temp 97.5°F | Resp 16 | Ht 67.0 in | Wt 153.6 lb

## 2016-11-10 DIAGNOSIS — R300 Dysuria: Secondary | ICD-10-CM | POA: Diagnosis not present

## 2016-11-10 MED ORDER — TRAMADOL HCL 50 MG PO TABS: 50.0000 mg | ORAL_TABLET | Freq: Four times a day (QID) | ORAL | 0 refills | Status: DC | PRN

## 2016-11-10 MED ORDER — CIPROFLOXACIN HCL 500 MG PO TABS: 500.0000 mg | ORAL_TABLET | Freq: Two times a day (BID) | ORAL | 0 refills | Status: AC

## 2016-11-10 NOTE — Progress Notes (Signed)
Assessment and Plan:   1. Dysuria -cipro -not likely a stone but if hematuria will send for pelvic ultrasound -tramadol to take prn for pain -did recommended use of ibuprofen 800 mg TID as needed -can use AZO for cramping if needed -likely UTI - Urinalysis, Routine w reflex microscopic - Urine culture     HPI 26 y.o.female presents for presentation of lower back pain and lower abdominal pain.  The pain is intermittent.  She reports that she struggled to sleep last night.  She reports that she has been drinking a lot of soda and tea.  She reports no blood in her urine.  She has urinary urgency and then she has dribbling.  She reports that she has had a history of OAB medications in the past.  She reports that she had UTI's in the past.  She reports no burning.  She reports that her bowel habits have been constipated.  She generally reports that she usually has runnier stools.  Her last BM was today at lunch and was normal.  It was soft and loose.  She reports no fevers or chills.  She had some nausea but no vomiting.  She has not had any pain in her thoracic spine since last Thursday.  She has never had a kidney stone prior.  She is on femara.  She reports that she is currently on the femara.  Her last period was Jan 23.  She has now done several cycles of femara.    Past Medical History:  Diagnosis Date  . Allergy      Allergies  Allergen Reactions  . Dexamethasone Rash  . Sulfa Antibiotics Rash  . Naproxen Other (See Comments)    GERD/Reflux  . Prednisone Rash  . Tessalon Perles [Benzonatate] Rash      Current Outpatient Prescriptions on File Prior to Visit  Medication Sig Dispense Refill  . albuterol (VENTOLIN HFA) 108 (90 Base) MCG/ACT inhaler Inhale 2 puffs into the lungs every 4 (four) hours as needed for wheezing or shortness of breath. 1 Inhaler 1  . letrozole (FEMARA) 2.5 MG tablet Take 2.5 mg by mouth daily.    . montelukast (SINGULAIR) 10 MG tablet TAKE 1 TABLET (10  MG TOTAL) BY MOUTH DAILY. 30 tablet 2  . rizatriptan (MAXALT-MLT) 10 MG disintegrating tablet DISSOLVE ONE TABLET IN MOUTH AS NEEDED FOR MIGRAINE. MAY REPEAT IN 2 HOURS AS NEEDED. 10 tablet 0   No current facility-administered medications on file prior to visit.     ROS: all negative except above.   Physical Exam: Filed Weights   11/10/16 1407  Weight: 153 lb 9.6 oz (69.7 kg)   BP 114/80   Pulse 76   Temp 97.5 F (36.4 C)   Resp 16   Ht 5\' 7"  (1.702 m)   Wt 153 lb 9.6 oz (69.7 kg)   BMI 24.06 kg/m  General Appearance: Well developed well nourished, non-toxic appearing in no apparent distress. Eyes: PERRLA, EOMs, conjunctiva w/ no swelling or erythema or discharge Sinuses: No Frontal/maxillary tenderness ENT/Mouth: Ear canals clear without swelling or erythema.  TM's normal bilaterally with no retractions, bulging, or loss of landmarks.   Neck: Supple, thyroid normal, no notable JVD  Respiratory: Respiratory effort normal, Clear breath sounds anteriorly and posteriorly bilaterally without rales, rhonchi, wheezing or stridor. No retractions or accessory muscle usage. Cardio: RRR with no MRGs.   Abdomen: Soft, + BS.  Mild suprapubic tenderness to palpation.  NO CVA tenderness bilaterally, no guarding, rebound,  hernias, masses.  Musculoskeletal: Full ROM, 5/5 strength, normal gait.  Skin: Warm, dry without rashes  Neuro: Awake and oriented X 3, Cranial nerves intact. Normal muscle tone, no cerebellar symptoms. Sensation intact.  Psych: normal affect, Insight and Judgment appropriate.     Terri Piedra, PA-C 2:09 PM [facility]

## 2016-11-12 ENCOUNTER — Encounter: Payer: Self-pay | Admitting: Internal Medicine

## 2016-11-12 LAB — URINALYSIS, ROUTINE W REFLEX MICROSCOPIC
Bilirubin Urine: NEGATIVE
Glucose, UA: NEGATIVE
Hgb urine dipstick: NEGATIVE
Ketones, ur: NEGATIVE
LEUKOCYTES UA: NEGATIVE
Nitrite: NEGATIVE
PROTEIN: NEGATIVE
Specific Gravity, Urine: 1.013 (ref 1.001–1.035)
pH: 6.5 (ref 5.0–8.0)

## 2016-11-12 LAB — URINE CULTURE: ORGANISM ID, BACTERIA: NO GROWTH

## 2016-11-30 ENCOUNTER — Encounter: Payer: Self-pay | Admitting: Internal Medicine

## 2016-11-30 ENCOUNTER — Ambulatory Visit (INDEPENDENT_AMBULATORY_CARE_PROVIDER_SITE_OTHER): Payer: BC Managed Care – PPO | Admitting: Internal Medicine

## 2016-11-30 VITALS — BP 126/90 | Temp 99.1°F | Wt 151.0 lb

## 2016-11-30 DIAGNOSIS — J0100 Acute maxillary sinusitis, unspecified: Secondary | ICD-10-CM

## 2016-11-30 MED ORDER — AZITHROMYCIN 250 MG PO TABS: ORAL_TABLET | ORAL | 0 refills | Status: DC

## 2016-11-30 MED ORDER — FLUCONAZOLE 150 MG PO TABS: 150.0000 mg | ORAL_TABLET | Freq: Once | ORAL | 0 refills | Status: AC

## 2016-11-30 MED ORDER — PROMETHAZINE-DM 6.25-15 MG/5ML PO SYRP: ORAL_SOLUTION | ORAL | 1 refills | Status: DC

## 2016-11-30 MED ORDER — FLUTICASONE PROPIONATE 50 MCG/ACT NA SUSP: 2.0000 | Freq: Every day | NASAL | 0 refills | Status: DC

## 2016-11-30 MED ORDER — AZELASTINE HCL 0.1 % NA SOLN: 2.0000 | Freq: Two times a day (BID) | NASAL | 2 refills | Status: DC

## 2016-11-30 NOTE — Progress Notes (Signed)
Patient ID: Isabel BowlCaryn E Hubbard, female   DOB: 1990-11-18, 26 y.o.   MRN: [id number] HPI  Patient presents to the office for evaluation of sinus congestion and pressure.  It has been going on for 9 days.  Patient reports night > day, wet, barky, worse with lying down.  They also endorse change in voice, chills, fever, postnasal drip, sputum production and sinus congestion, ear pain, sinus pressure, headaches, sore throat worse at night time..  They have tried mucinex and dayquil.  They report that nothing has worked.  They admits to other sick contacts.  Review of Systems  Constitutional: Positive for malaise/fatigue. Negative for chills and fever.  HENT: Positive for congestion, ear pain, hearing loss and sore throat.   Respiratory: Positive for cough. Negative for sputum production, shortness of breath and wheezing.   Cardiovascular: Negative for chest pain, palpitations and leg swelling.  Neurological: Positive for headaches.    PE:  Vitals:   11/30/16 1651  BP: 126/90  Temp: 99.1 F (37.3 C)    General:  Alert and non-toxic, WDWN, NAD HEENT: NCAT, PERLA, EOM normal, no occular discharge or erythema.  Nasal mucosal edema with sinus tenderness to palpation.  Oropharynx clear with minimal oropharyngeal edema and erythema.  Mucous membranes moist and pink. Neck:  Cervical adenopathy Chest:  RRR no MRGs.  Lungs clear to auscultation A&P with no wheezes rhonchi or rales.   Abdomen: +BS x 4 quadrants, soft, non-tender, no guarding, rigidity, or rebound. Skin: warm and dry no rash Neuro: A&Ox4, CN II-XII grossly intact  Assessment and Plan:   1. Acute non-recurrent maxillary sinusitis -flonase -zpak -diflucan for yeast caused by zpak -astelin -daily antihistamine -cont singulair -phenergan dm

## 2016-12-07 ENCOUNTER — Encounter: Payer: Self-pay | Admitting: Internal Medicine

## 2016-12-07 ENCOUNTER — Ambulatory Visit (INDEPENDENT_AMBULATORY_CARE_PROVIDER_SITE_OTHER): Payer: BC Managed Care – PPO | Admitting: Internal Medicine

## 2016-12-07 VITALS — BP 128/88 | HR 90 | Temp 98.4°F | Resp 16 | Ht 67.0 in | Wt 150.0 lb

## 2016-12-07 DIAGNOSIS — J329 Chronic sinusitis, unspecified: Secondary | ICD-10-CM | POA: Diagnosis not present

## 2016-12-07 MED ORDER — DICYCLOMINE HCL 20 MG PO TABS: 20.0000 mg | ORAL_TABLET | Freq: Three times a day (TID) | ORAL | 0 refills | Status: DC | PRN

## 2016-12-07 MED ORDER — RANITIDINE HCL 300 MG PO TABS: 300.0000 mg | ORAL_TABLET | Freq: Every day | ORAL | 1 refills | Status: DC

## 2016-12-07 MED ORDER — DEXAMETHASONE SODIUM PHOSPHATE 10 MG/ML IJ SOLN
10.0000 mg | Freq: Once | INTRAMUSCULAR | Status: AC
  Administered 2016-12-07: 10 mg via INTRAMUSCULAR

## 2016-12-07 MED ORDER — LEVOFLOXACIN 750 MG PO TABS: 750.0000 mg | ORAL_TABLET | Freq: Every day | ORAL | 0 refills | Status: DC

## 2016-12-07 NOTE — Progress Notes (Signed)
HPI  Patient presents to the office for reevaluation of cough.  She reports that she did temporarily get a lot better.  She reports that she was better for 4-5 days and then in the last two days she got a lot worse again.  It has been going on for 2 days.  Patient reports wet, barky, worse with lying down, she is having colored green sputum that she is coughing up and she is blowing out of her nose.  They also endorse change in voice, postnasal drip and nasal congestion, runny nose, cough, sinus pressure, and ear congestion.  .  They have tried cough medication and also nasal sprays and monteleukast.  They report that nothing has worked.  They admits to other sick contacts.  Review of Systems  Constitutional: Positive for malaise/fatigue. Negative for chills and fever.  HENT: Positive for congestion, ear pain, hearing loss and sore throat.   Respiratory: Positive for cough. Negative for sputum production, shortness of breath and wheezing.   Cardiovascular: Negative for chest pain, palpitations and leg swelling.  Neurological: Positive for headaches.    PE:  Vitals:   12/07/16 1537  BP: 128/88  Pulse: 90  Resp: 16  Temp: 98.4 F (36.9 C)   General:  Alert and non-toxic, WDWN, NAD HEENT: NCAT, PERLA, EOM normal, no occular discharge or erythema.  Nasal mucosal edema with sinus tenderness to palpation.  Oropharynx clear with minimal oropharyngeal edema and erythema.  Mucous membranes moist and pink. Neck:  Cervical adenopathy Chest:  RRR no MRGs.  Lungs clear to auscultation A&P with no wheezes rhonchi or rales.   Abdomen: +BS x 4 quadrants, soft, non-tender, no guarding, rigidity, or rebound. Skin: warm and dry no rash Neuro: A&Ox4, CN II-XII grossly intact  Assessment and Plan:   1. Recurrent sinusitis -cont nasal saline -cont flonase -cont singulair -cont inhalers as needed -cont phenergan dm as needed - levofloxacin (LEVAQUIN) 750 MG tablet; Take 1 tablet (750 mg total) by  mouth daily. X 7 days  Dispense: 7 tablet; Refill: 0 - dexamethasone (DECADRON) injection 10 mg; Inject 1 mL (10 mg total) into the muscle once.

## 2017-02-10 ENCOUNTER — Other Ambulatory Visit: Payer: Self-pay | Admitting: Physician Assistant

## 2017-02-10 MED ORDER — ALPRAZOLAM ER 0.5 MG PO TB24: 0.5000 mg | ORAL_TABLET | Freq: Every day | ORAL | 0 refills | Status: DC | PRN

## 2017-02-10 NOTE — Progress Notes (Signed)
Xanax called into pharmacy 9th May 2018 @ 9:20am by DD

## 2017-02-22 ENCOUNTER — Other Ambulatory Visit: Payer: Self-pay

## 2017-02-22 MED ORDER — MONTELUKAST SODIUM 10 MG PO TABS: 10.0000 mg | ORAL_TABLET | Freq: Every day | ORAL | 2 refills | Status: DC

## 2017-06-08 ENCOUNTER — Inpatient Hospital Stay (HOSPITAL_COMMUNITY)
Admission: AD | Admit: 2017-06-08 | Discharge: 2017-06-08 | Disposition: A | Discharge status: Home / Self Care | Payer: BC Managed Care – PPO | Source: Ambulatory Visit | Attending: Obstetrics and Gynecology | Admitting: Obstetrics and Gynecology

## 2017-06-08 ENCOUNTER — Encounter (HOSPITAL_COMMUNITY): Payer: Self-pay | Admitting: *Deleted

## 2017-06-08 DIAGNOSIS — O210 Mild hyperemesis gravidarum: Secondary | ICD-10-CM | POA: Diagnosis present

## 2017-06-08 DIAGNOSIS — O26891 Other specified pregnancy related conditions, first trimester: Secondary | ICD-10-CM | POA: Insufficient documentation

## 2017-06-08 DIAGNOSIS — J45909 Unspecified asthma, uncomplicated: Secondary | ICD-10-CM | POA: Diagnosis not present

## 2017-06-08 DIAGNOSIS — O161 Unspecified maternal hypertension, first trimester: Secondary | ICD-10-CM | POA: Insufficient documentation

## 2017-06-08 DIAGNOSIS — R55 Syncope and collapse: Secondary | ICD-10-CM | POA: Diagnosis present

## 2017-06-08 DIAGNOSIS — O9989 Other specified diseases and conditions complicating pregnancy, childbirth and the puerperium: Secondary | ICD-10-CM | POA: Diagnosis not present

## 2017-06-08 DIAGNOSIS — R42 Dizziness and giddiness: Secondary | ICD-10-CM | POA: Diagnosis present

## 2017-06-08 DIAGNOSIS — Z3A10 10 weeks gestation of pregnancy: Secondary | ICD-10-CM | POA: Diagnosis not present

## 2017-06-08 DIAGNOSIS — O99511 Diseases of the respiratory system complicating pregnancy, first trimester: Secondary | ICD-10-CM | POA: Insufficient documentation

## 2017-06-08 HISTORY — DX: Unspecified asthma, uncomplicated: J45.909

## 2017-06-08 HISTORY — DX: Essential (primary) hypertension: I10

## 2017-06-08 LAB — URINALYSIS, ROUTINE W REFLEX MICROSCOPIC
BILIRUBIN URINE: NEGATIVE
GLUCOSE, UA: NEGATIVE mg/dL
HGB URINE DIPSTICK: NEGATIVE
KETONES UR: NEGATIVE mg/dL
Nitrite: NEGATIVE
PH: 5 (ref 5.0–8.0)
Protein, ur: 30 mg/dL — AB
Specific Gravity, Urine: 1.025 (ref 1.005–1.030)

## 2017-06-08 MED ORDER — DEXTROSE IN LACTATED RINGERS 5 % IV SOLN
25.0000 mg | Freq: Once | INTRAVENOUS | Status: AC
  Administered 2017-06-08: 25 mg via INTRAVENOUS
  Filled 2017-06-08: qty 1

## 2017-06-08 MED ORDER — LACTATED RINGERS IV SOLN
Freq: Once | INTRAVENOUS | Status: DC
  Filled 2017-06-08: qty 1000

## 2017-06-08 MED ORDER — METOCLOPRAMIDE HCL 10 MG PO TABS: 10.0000 mg | ORAL_TABLET | Freq: Four times a day (QID) | ORAL | 0 refills | Status: DC

## 2017-06-08 NOTE — MAU Note (Signed)
Pt reports she had an episode of sweating, dizziness this am, vomited x one then almost passed out. No injury, did not fall.

## 2017-06-08 NOTE — Discharge Instructions (Signed)
Make sure you are eating a snack/small meal every 2-3 hours daily.  Be sure to have a protein and complex carbohydrate before bed.  Example: apple with peanut butter, crackers and cheese, half of turkey/chicken sandwich. Stay well-hydrated with at least 8-10 glasses of water.

## 2017-06-08 NOTE — MAU Provider Note (Signed)
History     CSN: [id number]  Arrival date and time: 06/08/17 [id number]   First Provider Initiated Contact with Patient 06/08/17 0915      Chief Complaint  Patient presents with  . Hyperemesis Gravidarum  . Dizziness   HPI  [name] is a 26 yo G1P0 at 10.[redacted] wks gestation presenting to MAU with complaints of dizziness, sweating, emesis x 1 episode and "almost passing out" this AM.  She denies any injury or falling.  She reports having a smoothie at 1900 yesterday.    Past Medical History:  Diagnosis Date  . Allergy   . Asthma   . Hypertension     Past Surgical History:  Procedure Laterality Date  . APPENDECTOMY    . CHOLECYSTECTOMY  12/27/14    Family History  Problem Relation Age of Onset  . Cancer Mother 8749       breast  . Hyperlipidemia Father   . Cancer Maternal Grandmother 3166       ovarian  . Stroke Maternal Grandfather   . Hyperlipidemia Paternal Grandmother   . Cancer Paternal Grandmother        lung  . Hyperlipidemia Paternal Grandfather   . COPD Paternal Grandfather     Social History  Substance Use Topics  . Smoking status: Never Smoker  . Smokeless tobacco: Never Used  . Alcohol use Yes     Comment: rare    Allergies:  Allergies  Allergen Reactions  . Dexamethasone Rash  . Sulfa Antibiotics Rash  . Naproxen Other (See Comments)    GERD/Reflux  . Prednisone Rash  . Tessalon Perles [Benzonatate] Rash    Prescriptions Prior to Admission  Medication Sig Dispense Refill Last Dose  . albuterol (VENTOLIN HFA) 108 (90 Base) MCG/ACT inhaler Inhale 2 puffs into the lungs every 4 (four) hours as needed for wheezing or shortness of breath. 1 Inhaler 1 Taking  . ALPRAZolam (XANAX XR) 0.5 MG 24 hr tablet Take 1 tablet (0.5 mg total) by mouth daily as needed for anxiety or sleep. 30 tablet 0   . azelastine (ASTELIN) 0.1 % nasal spray Place 2 sprays into both nostrils 2 (two) times daily. Use in each nostril as directed 30 mL 2 Taking  .  Cholecalciferol (VITAMIN D PO) Take 5,000 Units by mouth daily.   Taking  . dicyclomine (BENTYL) 20 MG tablet Take 1 tablet (20 mg total) by mouth 3 (three) times daily as needed for spasms. 60 tablet 0   . fluticasone (FLONASE) 50 MCG/ACT nasal spray Place 2 sprays into both nostrils daily. 16 g 0 Taking  . letrozole (FEMARA) 2.5 MG tablet Take 2.5 mg by mouth daily.   Taking  . levofloxacin (LEVAQUIN) 750 MG tablet Take 1 tablet (750 mg total) by mouth daily. X 7 days 7 tablet 0   . montelukast (SINGULAIR) 10 MG tablet Take 1 tablet (10 mg total) by mouth daily. 30 tablet 2   . Prenatal Vit-Fe Fumarate-FA (MULTIVITAMIN-PRENATAL) 27-0.8 MG TABS tablet Take 1 tablet by mouth daily at 12 noon.   Taking  . promethazine-dextromethorphan (PROMETHAZINE-DM) 6.25-15 MG/5ML syrup Take 5-10 ML PO q8hr prn for cough 180 mL 1 Taking  . ranitidine (ZANTAC) 300 MG tablet Take 1 tablet (300 mg total) by mouth at bedtime. 30 tablet 1   . rizatriptan (MAXALT-MLT) 10 MG disintegrating tablet DISSOLVE ONE TABLET IN MOUTH AS NEEDED FOR MIGRAINE. MAY REPEAT IN 2 HOURS AS NEEDED. 10 tablet 0 Taking  . traMADol (ULTRAM) 50  MG tablet Take 1 tablet (50 mg total) by mouth every 6 (six) hours as needed. 20 tablet 0 Taking    Review of Systems  HENT: Negative.   Eyes: Negative.   Respiratory: Negative.   Gastrointestinal: Positive for nausea and vomiting (x 1 episode).  Endocrine: Negative.   Genitourinary: Negative.   Musculoskeletal: Negative.   Skin: Negative.   Allergic/Immunologic: Negative.   Neurological: Positive for dizziness, weakness and light-headedness.  Hematological: Negative.   Psychiatric/Behavioral: Negative.    Physical Exam   Blood pressure 98/70, pulse 76, temperature 98.6 F (37 C), temperature source Oral, resp. rate 16, height 5\' 7"  (1.702 m), weight 62.1 kg (137 lb), last menstrual period 11/27/2016, SpO2 100 %.  Physical Exam  Constitutional: She is oriented to person, place, and  time. She appears well-developed and well-nourished.  HENT:  Head: Normocephalic.  Eyes: Pupils are equal, round, and reactive to light.  Neck: Normal range of motion.  Cardiovascular: Normal rate, regular rhythm and normal heart sounds.   Respiratory: Effort normal and breath sounds normal.  GI: Soft. Bowel sounds are normal.  Genitourinary:  Genitourinary Comments: Informal BS U/S: IUP with fetus and (+) cardiac activity visualized by CNM & spouse / pelvic deferred  Musculoskeletal: Normal range of motion.  Neurological: She is alert and oriented to person, place, and time.  Skin: Skin is warm and dry.  Psychiatric: She has a normal mood and affect. Her behavior is normal. Judgment and thought content normal.    MAU Course  Procedures  MDM CCUA IVFs: Liter #1 Phenergan 25 mg in 1000 ml of D5LR @ 999 ml/hr; then Liter #2 MVI in 1000 ml of LR @ 500 ml/hr Crackers and Gingerale challenge -- feels better and able to tolerate PO fluids and snack *Consult with Dr. Amado Nash @ 1218 - notified of patient's complaints, assessments, lab results, tx plan d/c home with Rx Reglan & keep scheduled appt tomorrow at [facility] - ok to d/c home, agrees with plan  Results for orders placed or performed during the hospital encounter of 06/08/17 (from the past 24 hour(s))  Urinalysis, Routine w reflex microscopic     Status: Abnormal   Collection Time: 06/08/17  8:29 AM  Result Value Ref Range   Color, Urine AMBER (A) YELLOW   APPearance CLOUDY (A) CLEAR   Specific Gravity, Urine 1.025 1.005 - 1.030   pH 5.0 5.0 - 8.0   Glucose, UA NEGATIVE NEGATIVE mg/dL   Hgb urine dipstick NEGATIVE NEGATIVE   Bilirubin Urine NEGATIVE NEGATIVE   Ketones, ur NEGATIVE NEGATIVE mg/dL   Protein, ur 30 (A) NEGATIVE mg/dL   Nitrite NEGATIVE NEGATIVE   Leukocytes, UA LARGE (A) NEGATIVE   RBC / HPF 6-30 0 - 5 RBC/hpf   WBC, UA 6-30 0 - 5 WBC/hpf   Bacteria, UA RARE (A) NONE SEEN   Squamous Epithelial / LPF 6-30 (A) NONE  SEEN   Mucus PRESENT     Assessment and Plan  Near syncope - Advised to eat small frequent meal/snack every 2-3 hrs daily - Advised to stay well-hydrated with at least 8-10 glasses of water.  Morning sickness - Rx for Reglan 10 mg PO every 6 hrs sent - Advised to eat a protein and complex carbohydrate before bed.  Example: apple with peanut butter, crackers and cheese, half of turkey/chicken sandwich.   Discharge home Patient verbalized an understanding of the plan of care and agrees.   Raelyn Mora, MSN, CNM 06/08/2017, 9:30 AM

## 2017-06-15 ENCOUNTER — Ambulatory Visit: Payer: Self-pay | Admitting: Physician Assistant

## 2017-07-29 ENCOUNTER — Other Ambulatory Visit: Payer: Self-pay | Admitting: Gastroenterology

## 2017-07-29 DIAGNOSIS — R1011 Right upper quadrant pain: Secondary | ICD-10-CM

## 2017-07-29 NOTE — Progress Notes (Signed)
Micheil Klaus MD 

## 2017-08-06 ENCOUNTER — Ambulatory Visit
Admission: RE | Admit: 2017-08-06 | Discharge: 2017-08-06 | Disposition: A | Discharge status: Home / Self Care | Payer: BC Managed Care – PPO | Source: Ambulatory Visit | Attending: Gastroenterology | Admitting: Gastroenterology

## 2017-08-06 DIAGNOSIS — R1011 Right upper quadrant pain: Secondary | ICD-10-CM

## 2017-09-06 ENCOUNTER — Encounter: Payer: Self-pay | Admitting: Physician Assistant

## 2017-09-23 ENCOUNTER — Other Ambulatory Visit: Payer: Self-pay | Admitting: *Deleted

## 2017-09-23 MED ORDER — RANITIDINE HCL 300 MG PO TABS: 300.0000 mg | ORAL_TABLET | Freq: Every day | ORAL | 0 refills | Status: DC

## 2017-09-30 ENCOUNTER — Encounter: Payer: Self-pay | Admitting: Internal Medicine

## 2017-11-01 ENCOUNTER — Other Ambulatory Visit (HOSPITAL_COMMUNITY): Payer: Self-pay | Admitting: Obstetrics & Gynecology

## 2017-11-01 DIAGNOSIS — Z3A31 31 weeks gestation of pregnancy: Secondary | ICD-10-CM

## 2017-11-01 DIAGNOSIS — Z3689 Encounter for other specified antenatal screening: Secondary | ICD-10-CM

## 2017-11-01 DIAGNOSIS — O26843 Uterine size-date discrepancy, third trimester: Secondary | ICD-10-CM

## 2017-11-02 ENCOUNTER — Ambulatory Visit (HOSPITAL_COMMUNITY)
Admission: RE | Admit: 2017-11-02 | Discharge: 2017-11-02 | Disposition: A | Discharge status: Home / Self Care | Payer: BC Managed Care – PPO | Source: Ambulatory Visit | Attending: Obstetrics & Gynecology | Admitting: Obstetrics & Gynecology

## 2017-11-02 ENCOUNTER — Ambulatory Visit (HOSPITAL_COMMUNITY): Admission: RE | Admit: 2017-11-02 | Payer: BC Managed Care – PPO | Source: Ambulatory Visit

## 2017-11-02 ENCOUNTER — Encounter (HOSPITAL_COMMUNITY): Payer: Self-pay

## 2017-11-02 ENCOUNTER — Other Ambulatory Visit (HOSPITAL_COMMUNITY): Payer: Self-pay | Admitting: Obstetrics & Gynecology

## 2017-11-02 DIAGNOSIS — Z3A31 31 weeks gestation of pregnancy: Secondary | ICD-10-CM

## 2017-11-02 DIAGNOSIS — Z3689 Encounter for other specified antenatal screening: Secondary | ICD-10-CM | POA: Insufficient documentation

## 2017-11-02 DIAGNOSIS — O26843 Uterine size-date discrepancy, third trimester: Secondary | ICD-10-CM

## 2017-11-02 DIAGNOSIS — O365930 Maternal care for other known or suspected poor fetal growth, third trimester, not applicable or unspecified: Secondary | ICD-10-CM | POA: Diagnosis not present

## 2017-11-02 DIAGNOSIS — R55 Syncope and collapse: Secondary | ICD-10-CM

## 2017-11-02 DIAGNOSIS — O210 Mild hyperemesis gravidarum: Secondary | ICD-10-CM

## 2017-11-02 HISTORY — DX: Depression, unspecified: F32.A

## 2017-11-02 HISTORY — DX: Anxiety disorder, unspecified: F41.9

## 2017-11-02 HISTORY — DX: Major depressive disorder, single episode, unspecified: F32.9

## 2017-11-03 ENCOUNTER — Other Ambulatory Visit (HOSPITAL_COMMUNITY): Payer: Self-pay

## 2017-11-20 ENCOUNTER — Inpatient Hospital Stay (HOSPITAL_COMMUNITY): Payer: BC Managed Care – PPO

## 2017-11-20 ENCOUNTER — Inpatient Hospital Stay (HOSPITAL_COMMUNITY)
Admission: AD | Admit: 2017-11-20 | Discharge: 2017-11-20 | Disposition: A | Discharge status: Home / Self Care | Payer: BC Managed Care – PPO | Source: Ambulatory Visit | Attending: Obstetrics and Gynecology | Admitting: Obstetrics and Gynecology

## 2017-11-20 ENCOUNTER — Encounter (HOSPITAL_COMMUNITY): Payer: Self-pay

## 2017-11-20 DIAGNOSIS — Z79899 Other long term (current) drug therapy: Secondary | ICD-10-CM | POA: Insufficient documentation

## 2017-11-20 DIAGNOSIS — O99343 Other mental disorders complicating pregnancy, third trimester: Secondary | ICD-10-CM | POA: Insufficient documentation

## 2017-11-20 DIAGNOSIS — F419 Anxiety disorder, unspecified: Secondary | ICD-10-CM | POA: Diagnosis not present

## 2017-11-20 DIAGNOSIS — J45909 Unspecified asthma, uncomplicated: Secondary | ICD-10-CM | POA: Diagnosis not present

## 2017-11-20 DIAGNOSIS — Z882 Allergy status to sulfonamides status: Secondary | ICD-10-CM | POA: Insufficient documentation

## 2017-11-20 DIAGNOSIS — O0903 Supervision of pregnancy with history of infertility, third trimester: Secondary | ICD-10-CM | POA: Insufficient documentation

## 2017-11-20 DIAGNOSIS — O99513 Diseases of the respiratory system complicating pregnancy, third trimester: Secondary | ICD-10-CM | POA: Insufficient documentation

## 2017-11-20 DIAGNOSIS — Z886 Allergy status to analgesic agent status: Secondary | ICD-10-CM | POA: Insufficient documentation

## 2017-11-20 DIAGNOSIS — T1490XA Injury, unspecified, initial encounter: Secondary | ICD-10-CM | POA: Diagnosis present

## 2017-11-20 DIAGNOSIS — O9A213 Injury, poisoning and certain other consequences of external causes complicating pregnancy, third trimester: Secondary | ICD-10-CM | POA: Diagnosis present

## 2017-11-20 DIAGNOSIS — F329 Major depressive disorder, single episode, unspecified: Secondary | ICD-10-CM | POA: Diagnosis not present

## 2017-11-20 DIAGNOSIS — Z3A33 33 weeks gestation of pregnancy: Secondary | ICD-10-CM | POA: Diagnosis present

## 2017-11-20 DIAGNOSIS — S3991XA Unspecified injury of abdomen, initial encounter: Secondary | ICD-10-CM

## 2017-11-20 DIAGNOSIS — O163 Unspecified maternal hypertension, third trimester: Secondary | ICD-10-CM | POA: Diagnosis not present

## 2017-11-20 LAB — CBC
HCT: 35.5 % — ABNORMAL LOW (ref 36.0–46.0)
HEMOGLOBIN: 12.5 g/dL (ref 12.0–15.0)
MCH: 32.4 pg (ref 26.0–34.0)
MCHC: 35.2 g/dL (ref 30.0–36.0)
MCV: 92 fL (ref 78.0–100.0)
PLATELETS: 174 10*3/uL (ref 150–400)
RBC: 3.86 MIL/uL — ABNORMAL LOW (ref 3.87–5.11)
RDW: 12.9 % (ref 11.5–15.5)
WBC: 9.9 10*3/uL (ref 4.0–10.5)

## 2017-11-20 LAB — COMPREHENSIVE METABOLIC PANEL
ALK PHOS: 97 U/L (ref 38–126)
ALT: 14 U/L (ref 14–54)
ANION GAP: 6 (ref 5–15)
AST: 19 U/L (ref 15–41)
Albumin: 2.9 g/dL — ABNORMAL LOW (ref 3.5–5.0)
BUN: 7 mg/dL (ref 6–20)
CALCIUM: 8.8 mg/dL — AB (ref 8.9–10.3)
CO2: 20 mmol/L — ABNORMAL LOW (ref 22–32)
Chloride: 108 mmol/L (ref 101–111)
Creatinine, Ser: 0.49 mg/dL (ref 0.44–1.00)
Glucose, Bld: 101 mg/dL — ABNORMAL HIGH (ref 65–99)
Potassium: 3.8 mmol/L (ref 3.5–5.1)
Sodium: 134 mmol/L — ABNORMAL LOW (ref 135–145)
TOTAL PROTEIN: 6 g/dL — AB (ref 6.5–8.1)
Total Bilirubin: 0.5 mg/dL (ref 0.3–1.2)

## 2017-11-20 LAB — PROTEIN / CREATININE RATIO, URINE: CREATININE, URINE: 45 mg/dL

## 2017-11-20 LAB — URIC ACID: Uric Acid, Serum: 5.2 mg/dL (ref 2.3–6.6)

## 2017-11-20 MED ORDER — ACETAMINOPHEN 500 MG PO TABS
1000.0000 mg | ORAL_TABLET | Freq: Once | ORAL | Status: AC
  Administered 2017-11-20: 1000 mg via ORAL
  Filled 2017-11-20: qty 2

## 2017-11-20 NOTE — MAU Provider Note (Signed)
History     Chief Complaint  Patient presents with  . Fall    27 yo G1P0 MWF @ 33 3/[redacted] week gestation presents for evaluation after Falling on left side. Pt denies direct trauma to abdomen. (+_) FM no vaginal bleeding or abdominal pain   OB History    Gravida Para Term Preterm AB Living   1         0   SAB TAB Ectopic Multiple Live Births                  Past Medical History:  Diagnosis Date  . Allergy   . Anxiety   . Asthma   . Depression   . Hypertension     Past Surgical History:  Procedure Laterality Date  . APPENDECTOMY    . CHOLECYSTECTOMY  12/27/14    Family History  Problem Relation Age of Onset  . Cancer Mother 38       breast  . Hyperlipidemia Father   . Cancer Maternal Grandmother 18       ovarian  . Stroke Maternal Grandfather   . Hyperlipidemia Paternal Grandmother   . Cancer Paternal Grandmother        lung  . Hyperlipidemia Paternal Grandfather   . COPD Paternal Grandfather     Social History   Tobacco Use  . Smoking status: Never Smoker  . Smokeless tobacco: Never Used  Substance Use Topics  . Alcohol use: No    Frequency: Never    Comment: rare  . Drug use: No    Allergies:  Allergies  Allergen Reactions  . Dexamethasone Rash  . Sulfa Antibiotics Rash  . Naproxen Other (See Comments)    GERD/Reflux  . Betadine [Povidone Iodine] Rash  . Prednisone Rash  . Tessalon Perles [Benzonatate] Rash    Medications Prior to Admission  Medication Sig Dispense Refill Last Dose  . acetaminophen (TYLENOL) 325 MG tablet Take 650 mg by mouth every 6 (six) hours as needed for mild pain or headache.   11/19/2017 at Unknown time  . albuterol (VENTOLIN HFA) 108 (90 Base) MCG/ACT inhaler Inhale 2 puffs into the lungs every 4 (four) hours as needed for wheezing or shortness of breath. 1 Inhaler 1 Past Week at Unknown time  . busPIRone (BUSPAR) 5 MG tablet Take 5 mg by mouth 2 (two) times daily.   11/19/2017 at Unknown time  .  butalbital-acetaminophen-caffeine (FIORICET, ESGIC) 50-325-40 MG tablet Take 1 tablet by mouth every 6 (six) hours as needed for headache or migraine.    11/19/2017 at Unknown time  . doxylamine, Sleep, (UNISOM) 25 MG tablet Take 25 mg by mouth at bedtime as needed for sleep.   Past Week at Unknown time  . montelukast (SINGULAIR) 10 MG tablet Take 1 tablet (10 mg total) by mouth daily. 30 tablet 2 11/19/2017 at Unknown time  . pantoprazole (PROTONIX) 40 MG tablet Take 40 mg by mouth daily.   11/20/2017 at Unknown time  . Prenatal Vit-Fe Fumarate-FA (MULTIVITAMIN-PRENATAL) 27-0.8 MG TABS tablet Take 1 tablet by mouth at bedtime.    11/19/2017 at Unknown time  . metoCLOPramide (REGLAN) 10 MG tablet Take 1 tablet (10 mg total) by mouth every 6 (six) hours. (Patient not taking: Reported on 11/20/2017) 30 tablet 0 Not Taking at Unknown time  . promethazine-dextromethorphan (PROMETHAZINE-DM) 6.25-15 MG/5ML syrup Take 5-10 ML PO q8hr prn for cough (Patient not taking: Reported on 06/08/2017) 180 mL 1 Not Taking at Unknown time  . ranitidine (  ZANTAC) 300 MG tablet Take 1 tablet (300 mg total) by mouth at bedtime. (Patient not taking: Reported on 11/02/2017) 90 tablet 0 Not Taking at Unknown time  . rizatriptan (MAXALT-MLT) 10 MG disintegrating tablet DISSOLVE ONE TABLET IN MOUTH AS NEEDED FOR MIGRAINE. MAY REPEAT IN 2 HOURS AS NEEDED. (Patient not taking: Reported on 06/08/2017) 10 tablet 0 Not Taking at Unknown time     Physical Exam   Blood pressure 136/83, pulse 65, temperature 98 F (36.7 C), temperature source Oral, resp. rate 18, height 5\' 8"  (1.727 m), weight 77.1 kg (170 lb), last menstrual period 03/30/2017.  General appearance: alert, cooperative and no distress Lungs: clear to auscultation bilaterally Heart: regular rate and rhythm, S1, S2 normal, no murmur, click, rub or gallop Abdomen: gravid nontender Pelvic: deferred Extremities: no edema, redness or tenderness in the calves or thighs   Tracing:  baseline 135 (+) accel 15 x 15 No ctx   ED Course  IMP: s/p Fall IUP@ 33 3/7  Elev BP( pre-pregnancy hx HTN ) P) monitor x 4 hr.  PIH labs .sono check placenta( r/o abruption) MDM Addendum:   Isabel KyleSheronette A Parnika Tweten, MD 10:45 AM 11/20/2017   Addendum: plt 174K, hgb 12.5 AST/ALT 19/14. Uric acid 5.2 Protein/creatine pending Sono: nl placenta nl fluid  d/c home. Reassuring testing PIH warning signs. Keep sched OB appt

## 2017-11-20 NOTE — MAU Note (Signed)
Fell at home around [phone] onto left hip. No bleeding, no new onset pain, +FM

## 2017-11-20 NOTE — Discharge Instructions (Signed)
Preterm labor precautions, daily kick ct   Preterm Labor and Birth Information Pregnancy normally lasts 39-41 weeks. Preterm labor is when labor starts early. It starts before you have been pregnant for 37 whole weeks. What are the risk factors for preterm labor? Preterm labor is more likely to occur in women who:  Have an infection while pregnant.  Have a cervix that is short.  Have gone into preterm labor before.  Have had surgery on their cervix.  Are younger than age 29.  Are older than age 29.  Are African American.  Are pregnant with two or more babies.  Take street drugs while pregnant.  Smoke while pregnant.  Do not gain enough weight while pregnant.  Got pregnant right after another pregnancy.  What are the symptoms of preterm labor? Symptoms of preterm labor include:  Cramps. The cramps may feel like the cramps some women get during their period. The cramps may happen with watery poop (diarrhea).  Pain in the belly (abdomen).  Pain in the lower back.  Regular contractions or tightening. It may feel like your belly is getting tighter.  Pressure in the lower belly that seems to get stronger.  More fluid (discharge) leaking from the vagina. The fluid may be watery or bloody.  Water breaking.  Why is it important to notice signs of preterm labor? Babies who are born early may not be fully developed. They have a higher chance for:  Long-term heart problems.  Long-term lung problems.  Trouble controlling body systems, like breathing.  Bleeding in the brain.  A condition called cerebral palsy.  Learning difficulties.  Death.  These risks are highest for babies who are born before 34 weeks of pregnancy. How is preterm labor treated? Treatment depends on:  How long you were pregnant.  Your condition.  The health of your baby.  Treatment may involve:  Having a stitch (suture) placed in your cervix. When you give birth, your cervix opens so  the baby can come out. The stitch keeps the cervix from opening too soon.  Staying at the hospital.  Taking or getting medicines, such as: ? Hormone medicines. ? Medicines to stop contractions. ? Medicines to help the babys lungs develop. ? Medicines to prevent your baby from having cerebral palsy.  What should I do if I am in preterm labor? If you think you are going into labor too soon, call your doctor right away. How can I prevent preterm labor?  Do not use any tobacco products. ? Examples of these are cigarettes, chewing tobacco, and e-cigarettes. ? If you need help quitting, ask your doctor.  Do not use street drugs.  Do not use any medicines unless you ask your doctor if they are safe for you.  Talk with your doctor before taking any herbal supplements.  Make sure you gain enough weight.  Watch for infection. If you think you might have an infection, get it checked right away.  If you have gone into preterm labor before, tell your doctor. This information is not intended to replace advice given to you by your health care provider. Make sure you discuss any questions you have with your health care provider. Document Released: 12/18/2008 Document Revised: 03/03/2016 Document Reviewed: 02/12/2016 Elsevier Interactive Patient Education  2018 Elsevier Inc.    Fetal Movement Counts Patient Name: ________________________________________________ Patient Due Date: ____________________ What is a fetal movement count? A fetal movement count is the number of times that you feel your baby move during a  certain amount of time. This may also be called a fetal kick count. A fetal movement count is recommended for every pregnant woman. You may be asked to start counting fetal movements as early as week 28 of your pregnancy. Pay attention to when your baby is most active. You may notice your baby's sleep and wake cycles. You may also notice things that make your baby move more. You  should do a fetal movement count:  When your baby is normally most active.  At the same time each day.  A good time to count movements is while you are resting, after having something to eat and drink. How do I count fetal movements? 1. Find a quiet, comfortable area. Sit, or lie down on your side. 2. Write down the date, the start time and stop time, and the number of movements that you felt between those two times. Take this information with you to your health care visits. 3. For 2 hours, count kicks, flutters, swishes, rolls, and jabs. You should feel at least 10 movements during 2 hours. 4. You may stop counting after you have felt 10 movements. 5. If you do not feel 10 movements in 2 hours, have something to eat and drink. Then, keep resting and counting for 1 hour. If you feel at least 4 movements during that hour, you may stop counting. Contact a health care provider if:  You feel fewer than 4 movements in 2 hours.  Your baby is not moving like he or she usually does. Date: ____________ Start time: ____________ Stop time: ____________ Movements: ____________ Date: ____________ Start time: ____________ Stop time: ____________ Movements: ____________ Date: ____________ Start time: ____________ Stop time: ____________ Movements: ____________ Date: ____________ Start time: ____________ Stop time: ____________ Movements: ____________ Date: ____________ Start time: ____________ Stop time: ____________ Movements: ____________ Date: ____________ Start time: ____________ Stop time: ____________ Movements: ____________ Date: ____________ Start time: ____________ Stop time: ____________ Movements: ____________ Date: ____________ Start time: ____________ Stop time: ____________ Movements: ____________ Date: ____________ Start time: ____________ Stop time: ____________ Movements: ____________ This information is not intended to replace advice given to you by your health care provider. Make sure  you discuss any questions you have with your health care provider. Document Released: 10/21/2006 Document Revised: 05/20/2016 Document Reviewed: 10/31/2015 Elsevier Interactive Patient Education  [employer].

## 2017-11-26 ENCOUNTER — Other Ambulatory Visit: Payer: Self-pay

## 2017-11-26 ENCOUNTER — Encounter (HOSPITAL_COMMUNITY): Payer: Self-pay | Admitting: *Deleted

## 2017-11-26 ENCOUNTER — Inpatient Hospital Stay (HOSPITAL_COMMUNITY)
Admission: AD | Admit: 2017-11-26 | Discharge: 2017-11-26 | Disposition: A | Discharge status: Home / Self Care | Payer: BC Managed Care – PPO | Source: Ambulatory Visit | Attending: Obstetrics and Gynecology | Admitting: Obstetrics and Gynecology

## 2017-11-26 DIAGNOSIS — Z3A34 34 weeks gestation of pregnancy: Secondary | ICD-10-CM | POA: Insufficient documentation

## 2017-11-26 DIAGNOSIS — O10913 Unspecified pre-existing hypertension complicating pregnancy, third trimester: Secondary | ICD-10-CM

## 2017-11-26 DIAGNOSIS — Z886 Allergy status to analgesic agent status: Secondary | ICD-10-CM | POA: Diagnosis not present

## 2017-11-26 DIAGNOSIS — Z882 Allergy status to sulfonamides status: Secondary | ICD-10-CM | POA: Insufficient documentation

## 2017-11-26 DIAGNOSIS — Z888 Allergy status to other drugs, medicaments and biological substances status: Secondary | ICD-10-CM | POA: Diagnosis not present

## 2017-11-26 DIAGNOSIS — O10919 Unspecified pre-existing hypertension complicating pregnancy, unspecified trimester: Secondary | ICD-10-CM

## 2017-11-26 DIAGNOSIS — O163 Unspecified maternal hypertension, third trimester: Secondary | ICD-10-CM | POA: Diagnosis present

## 2017-11-26 LAB — CBC
HEMATOCRIT: 36.1 % (ref 36.0–46.0)
Hemoglobin: 12.7 g/dL (ref 12.0–15.0)
MCH: 32.6 pg (ref 26.0–34.0)
MCHC: 35.2 g/dL (ref 30.0–36.0)
MCV: 92.8 fL (ref 78.0–100.0)
Platelets: 179 10*3/uL (ref 150–400)
RBC: 3.89 MIL/uL (ref 3.87–5.11)
RDW: 12.9 % (ref 11.5–15.5)
WBC: 9.1 10*3/uL (ref 4.0–10.5)

## 2017-11-26 LAB — URINALYSIS, ROUTINE W REFLEX MICROSCOPIC
Bacteria, UA: NONE SEEN
Bilirubin Urine: NEGATIVE
GLUCOSE, UA: NEGATIVE mg/dL
Hgb urine dipstick: NEGATIVE
KETONES UR: NEGATIVE mg/dL
NITRITE: NEGATIVE
PROTEIN: NEGATIVE mg/dL
Specific Gravity, Urine: 1.027 (ref 1.005–1.030)
pH: 6 (ref 5.0–8.0)

## 2017-11-26 LAB — COMPREHENSIVE METABOLIC PANEL
ALBUMIN: 3 g/dL — AB (ref 3.5–5.0)
ALT: 11 U/L — AB (ref 14–54)
AST: 20 U/L (ref 15–41)
Alkaline Phosphatase: 111 U/L (ref 38–126)
Anion gap: 7 (ref 5–15)
BILIRUBIN TOTAL: 0.5 mg/dL (ref 0.3–1.2)
BUN: 11 mg/dL (ref 6–20)
CHLORIDE: 105 mmol/L (ref 101–111)
CO2: 20 mmol/L — ABNORMAL LOW (ref 22–32)
CREATININE: 0.46 mg/dL (ref 0.44–1.00)
Calcium: 8.6 mg/dL — ABNORMAL LOW (ref 8.9–10.3)
GFR calc Af Amer: >60 mL/min (ref >60)
Glucose, Bld: 102 mg/dL — ABNORMAL HIGH (ref 65–99)
POTASSIUM: 4 mmol/L (ref 3.5–5.1)
Sodium: 132 mmol/L — ABNORMAL LOW (ref 135–145)
TOTAL PROTEIN: 6.3 g/dL — AB (ref 6.5–8.1)

## 2017-11-26 LAB — PROTEIN / CREATININE RATIO, URINE
Creatinine, Urine: 141 mg/dL
Protein Creatinine Ratio: 0.14 mg/mg{Cre} (ref 0.00–0.15)
Total Protein, Urine: 20 mg/dL

## 2017-11-26 MED ORDER — LABETALOL HCL 200 MG PO TABS: 200.0000 mg | ORAL_TABLET | Freq: Two times a day (BID) | ORAL | 1 refills | Status: DC

## 2017-11-26 MED ORDER — ALPRAZOLAM 0.5 MG PO TABS
0.5000 mg | ORAL_TABLET | Freq: Once | ORAL | Status: AC
  Administered 2017-11-26: 0.5 mg via ORAL
  Filled 2017-11-26: qty 1

## 2017-11-26 MED ORDER — LABETALOL HCL 200 MG PO TABS: 200.0000 mg | ORAL_TABLET | Freq: Two times a day (BID) | ORAL | 3 refills | Status: DC

## 2017-11-26 NOTE — MAU Note (Signed)
Just left OB's office, BP is sky high.  Baby hasn't grown in like 3 wks.  Denies HA and visual changes, RUQ off and on- none currently. Reports increased swelling in feet.

## 2017-11-26 NOTE — MAU Provider Note (Addendum)
History   Patient LILIAHNA CUDD is a 27 y.o. G1P0 At [redacted]w[redacted]d here after having severe range pressures at her ob appointment today. She denies bleeding, leaking of fluid, or decreased fetal movements.  She has had mild right-sided upper quadrant pain which may be related to liver stones (which she has had for several months).  She denies blurry vision, she had a headache over a  week ago which she took Fioricet for. She has not had a headache since Feb 15.   Dr. Amado Nash sent her for urgent evaluation after elevated pressures of 180/104.  Patient was anxious at the Dr's office today after she received news that the baby had not grown in 3 weeks. She was very upset in the office. She has had a very difficult week at work and with ED visits for her husband this week. She feels like "everything piled up".  CSN: [id number]   She states that her blood pressure has been normal in pregnancy but that she chronic blood pressure problems in the past, which were relieved once she started taking zoloft and xanax. In pregnancy she has been on buspar.  Arrival date and time: 11/26/17 1528   None     Chief Complaint  Patient presents with  . Hypertension   HPI   RUQ started this week and comes and goes; it doesn't seem to be related to eating or drinking. She rates it a 6/10. Nothing makes it better or worse; she has tried nothing for it. She has no associated symptoms.  She has also had some abdominal cramping that comes and goes. Nothing makes it better or worse; occasional it radiates to her lower back. It is releived with position changes.   She denies abnormal discharge, burning with urination.    OB History    Gravida Para Term Preterm AB Living   1         0   SAB TAB Ectopic Multiple Live Births                  Past Medical History:  Diagnosis Date  . Allergy   . Anxiety   . Asthma   . Depression   . Hypertension     Past Surgical History:  Procedure Laterality Date  .  APPENDECTOMY    . CHOLECYSTECTOMY  12/27/14  . WISDOM TOOTH EXTRACTION      Family History  Problem Relation Age of Onset  . Cancer Mother 60       breast  . Hyperlipidemia Father   . Cancer Maternal Grandmother 57       ovarian  . Stroke Maternal Grandfather   . Hyperlipidemia Paternal Grandmother   . Cancer Paternal Grandmother        lung  . Hyperlipidemia Paternal Grandfather   . COPD Paternal Grandfather     Social History   Tobacco Use  . Smoking status: Never Smoker  . Smokeless tobacco: Never Used  Substance Use Topics  . Alcohol use: No    Frequency: Never    Comment: rare  . Drug use: No    Allergies:  Allergies  Allergen Reactions  . Dexamethasone Rash  . Sulfa Antibiotics Rash  . Naproxen Other (See Comments)    GERD/Reflux  . Betadine [Povidone Iodine] Rash  . Prednisone Rash  . Tessalon Perles [Benzonatate] Rash    Medications Prior to Admission  Medication Sig Dispense Refill Last Dose  . acetaminophen (TYLENOL) 325 MG tablet Take 650 mg  by mouth every 6 (six) hours as needed for mild pain or headache.   11/19/2017 at Unknown time  . albuterol (VENTOLIN HFA) 108 (90 Base) MCG/ACT inhaler Inhale 2 puffs into the lungs every 4 (four) hours as needed for wheezing or shortness of breath. 1 Inhaler 1 Past Week at Unknown time  . busPIRone (BUSPAR) 5 MG tablet Take 5 mg by mouth 2 (two) times daily.   11/19/2017 at Unknown time  . butalbital-acetaminophen-caffeine (FIORICET, ESGIC) 50-325-40 MG tablet Take 1 tablet by mouth every 6 (six) hours as needed for headache or migraine.    11/19/2017 at Unknown time  . doxylamine, Sleep, (UNISOM) 25 MG tablet Take 25 mg by mouth at bedtime as needed for sleep.   Past Week at Unknown time  . montelukast (SINGULAIR) 10 MG tablet Take 1 tablet (10 mg total) by mouth daily. 30 tablet 2 11/19/2017 at Unknown time  . pantoprazole (PROTONIX) 40 MG tablet Take 40 mg by mouth daily.   11/20/2017 at Unknown time  . Prenatal  Vit-Fe Fumarate-FA (MULTIVITAMIN-PRENATAL) 27-0.8 MG TABS tablet Take 1 tablet by mouth at bedtime.    11/19/2017 at Unknown time    Review of Systems  Constitutional: Negative.   HENT: Negative.   Respiratory: Negative.   Cardiovascular: Negative.   Gastrointestinal: Negative.   Genitourinary: Negative.   Musculoskeletal: Negative.   Neurological: Negative.    Physical Exam   Blood pressure (!) 147/97, pulse 64, temperature 98.4 F (36.9 C), temperature source Oral, resp. rate 16, weight 173 lb 3.8 oz (78.6 kg), last menstrual period 03/30/2017, SpO2 100 %.  Physical Exam  Constitutional: She is oriented to person, place, and time. She appears well-developed.  HENT:  Head: Normocephalic.  Neck: Normal range of motion.  Respiratory: Effort normal.  GI: Soft.  Genitourinary: Vagina normal.  Musculoskeletal: Normal range of motion.  Neurological: She is alert and oriented to person, place, and time.  Skin: Skin is warm and dry.  Psychiatric: She has a normal mood and affect.    MAU Course  Procedures  MDM -patient's blood pressure was in the normal range in triage; two pressures were taken before Xanax was given.  -Majority of BP normal in MAU -CBC, CMP UPC normal.  - NST: 135 bpm, mod var, present acel, neg decels. No contractions  Discussed lab findings, NST and patient's physical symptoms with Dr. Amado NashAlmquist, who recommends DC on labetalol with follow up appt on Monday.  Dr. Amado NashAlmquist to discuss plan of care with patient as well.   Patient *did not* receive BMZ on 11-26-2017 in the office or in MAU, per Dr. Amado NashAlmquist.  Assessment and Plan   1. Chronic hypertension during pregnancy, antepartum    2. Patient stable for discharge with RX for labetalol 200 mg BID.  3. Return precautions reviewed, including but not limited to bleeding, severe HA, decreased fetal movement, epigastric pain, blurry vision, sudden swelling, severe abdominal pain and NV.  4. Patient to start 24  urine collection this evening or tomorrow (Dr. Amado NashAlmquist to order).  5. Patient verbalized understanding; all questions answered.   Charlesetta GaribaldiKathryn Lorraine Vansh Reckart CNM 11/26/2017, 4:18 PM

## 2017-11-26 NOTE — H&P (Signed)
Isabel Hubbard is a 27 y.o. female G1P0 at 37.3 wga presenting from office d/t severe range bp.  BP 160/105 then repeat systolic about 180.  Patient has not had elevated bp this pregnancy and denies any h/a, vision changes, ruq pain. She has a significant anxiety d/o and has had a very stressful week with taking her husband to ED and other stressors.  Antepartum course has also been complicated by lagging [facility] at 0%, abnormal dopplars today, no AEDF.  Pt reports being very anxious today after hearing new about baby, though lagging [facility] has been followed for several weeks.  [facility] at [facility] since 10 wks History OB History    Gravida Para Term Preterm AB Living   1         0   SAB TAB Ectopic Multiple Live Births                 Past Medical History:  Diagnosis Date  . Allergy   . Anxiety   . Asthma   . Depression   . Hypertension    Past Surgical History:  Procedure Laterality Date  . APPENDECTOMY    . CHOLECYSTECTOMY  12/27/14  . WISDOM TOOTH EXTRACTION     Family History: family history includes COPD in her paternal grandfather; Cancer in her paternal grandmother; Cancer (age of onset: 23) in her mother; Cancer (age of onset: 42) in her maternal grandmother; Hyperlipidemia in her father, paternal grandfather, and paternal grandmother; Stroke in her maternal grandfather. Social History:  reports that  has never smoked. she has never used smokeless tobacco. She reports that she does not drink alcohol or use drugs.  ROS    Blood pressure (!) 121/94, pulse 68, temperature 98.4 F (36.9 C), temperature source Oral, resp. rate 16, weight 173 lb 3.8 oz (78.6 kg), last menstrual period 03/30/2017, SpO2 100 %.   Patient Vitals for the past 24 hrs:  BP Temp Temp src Pulse Resp SpO2 Weight  11/26/17 1915 (!) 121/94 - - 68 - - -  11/26/17 1900 132/86 - - 62 - - -  11/26/17 1845 (!) 132/91 - - 67 - - -  11/26/17 1831 130/77 - - 70 - - -  11/26/17 1816 125/78 - - 77 - - -  11/26/17  1800 128/89 - - 68 - - -  11/26/17 1745 128/89 - - 80 - - -  11/26/17 1731 (!) 134/94 - - 64 - - -  11/26/17 1715 117/86 - - 80 - - -  11/26/17 1701 125/81 - - 66 - - -  11/26/17 1645 (!) 140/96 - - 65 - - -  11/26/17 1630 (!) 143/105 - - 67 - - -  11/26/17 1615 134/86 - - (!) 59 - - -  11/26/17 1603 (!) 147/97 - - 64 - - -  11/26/17 1546 (!) 148/97 98.4 F (36.9 C) Oral 64 16 100 % 173 lb 3.8 oz (78.6 kg)     Exam Physical Exam  Prenatal labs: ABO, Rh:   Antibody:   Rubella:   RPR:    HBsAg:    HIV:    GBS:    1 hr Glucola Genetic screening  Anatomy US  Physical Exam:  A&O x 3 HEENT : grossly wnl Lungs : ctab CV rrr Abdo : soft, nt, nd Extr tr edema, nt bilat LE Pelvic deferred  FHT: cat 1 TOCO; 1 ctx   Assessment/Plan: iup at 34.3 1. GHTN - reveiwed with pt  and husband normal labs and pr/cr urine; while patient does have anxiety d/o appears that now also GHTN; bp mild range to normal - recommend begin labetalol 200mg  po bid - will d/c pt home and she will complete 24 hr urine and return tomorrow night/Sunday am; plan modified bedrest over weekend; pih precautions reviewed; will f/u in office in 3 days 2. Fetal status reassuring - plan repeat u/s with dopplers in 3 days 3. Pre-term: pt not given bmz as she reports allergy to steroid injections in past 4. Lagging ac, poor fetal growth - plan repeat u/s with dopplers in 3 days   Vick FreesSusan E Mackay Hanauer 11/26/2017, 7:54 PM

## 2017-11-26 NOTE — Discharge Instructions (Signed)
-  call office on Monday for appt with Dr. Ernestina PennaFogleman and for repeat dopplers   Hypertension During Pregnancy Hypertension is also called high blood pressure. High blood pressure means that the force of your blood moving in your body is too strong. When you are pregnant, this condition should be watched carefully. It can cause problems for you and your baby. Follow these instructions at home: Eating and drinking  Drink enough fluid to keep your pee (urine) clear or pale yellow.  Eat healthy foods that are low in salt (sodium). ? Do not add salt to your food. ? Check labels on foods and drinks to see much salt is in them. Look on the label where you see "Sodium." Lifestyle  Do not use any products that contain nicotine or tobacco, such as cigarettes and e-cigarettes. If you need help quitting, ask your doctor.  Do not use alcohol.  Avoid caffeine.  Avoid stress. Rest and get plenty of sleep. General instructions  Take over-the-counter and prescription medicines only as told by your doctor.  While lying down, lie on your left side. This keeps pressure off your baby.  While sitting or lying down, raise (elevate) your feet. Try putting some pillows under your lower legs.  Exercise regularly. Ask your doctor what kinds of exercise are best for you.  Keep all prenatal and follow-up visits as told by your doctor. This is important. Contact a doctor if:  You have symptoms that your doctor told you to watch for, such as: ? Fever. ? Throwing up (vomiting). ? Headache. Get help right away if:  You have very bad pain in your belly (abdomen).  You are throwing up, and this does not get better with treatment.  You suddenly get swelling in your hands, ankles, or face.  You gain 4 lb (1.8 kg) or more in 1 week.  You get bleeding from your vagina.  You have blood in your pee.  You do not feel your baby moving as much as normal.  You have a change in vision.  You have muscle  twitching or sudden tightening (spasms).  You have trouble breathing.  Your lips or fingernails turn blue. This information is not intended to replace advice given to you by your health care provider. Make sure you discuss any questions you have with your health care provider. Document Released: 10/24/2010 Document Revised: 06/02/2016 Document Reviewed: 06/02/2016 Elsevier Interactive Patient Education  [employer]2018 [employer].

## 2017-11-26 NOTE — MAU Note (Signed)
Discharge papers discussed with patient. 24-hour collection reviewed with patient. Pt voiced understanding.

## 2017-11-28 ENCOUNTER — Other Ambulatory Visit (HOSPITAL_COMMUNITY)
Admission: RE | Admit: 2017-11-28 | Discharge: 2017-11-28 | Disposition: A | Discharge status: Home / Self Care | Payer: BC Managed Care – PPO | Source: Ambulatory Visit | Attending: Obstetrics and Gynecology | Admitting: Obstetrics and Gynecology

## 2017-11-28 ENCOUNTER — Other Ambulatory Visit: Payer: Self-pay | Admitting: Student

## 2017-11-28 DIAGNOSIS — O10919 Unspecified pre-existing hypertension complicating pregnancy, unspecified trimester: Secondary | ICD-10-CM

## 2017-11-28 LAB — PROTEIN, URINE, 24 HOUR
Collection Interval-UPROT: 24 hours
Protein, 24H Urine: 127 mg/d — ABNORMAL HIGH (ref 50–100)
Protein, Urine: 11 mg/dL
URINE TOTAL VOLUME-UPROT: 1150 mL

## 2017-12-07 ENCOUNTER — Inpatient Hospital Stay (HOSPITAL_COMMUNITY)
Admission: AD | Admit: 2017-12-07 | Discharge: 2017-12-08 | Disposition: A | Discharge status: Home / Self Care | Payer: BC Managed Care – PPO | Source: Ambulatory Visit | Attending: Obstetrics and Gynecology | Admitting: Obstetrics and Gynecology

## 2017-12-07 ENCOUNTER — Encounter (HOSPITAL_COMMUNITY): Payer: Self-pay | Admitting: *Deleted

## 2017-12-07 DIAGNOSIS — D589 Hereditary hemolytic anemia, unspecified: Secondary | ICD-10-CM | POA: Diagnosis not present

## 2017-12-07 DIAGNOSIS — O99513 Diseases of the respiratory system complicating pregnancy, third trimester: Secondary | ICD-10-CM | POA: Insufficient documentation

## 2017-12-07 DIAGNOSIS — F419 Anxiety disorder, unspecified: Secondary | ICD-10-CM | POA: Diagnosis not present

## 2017-12-07 DIAGNOSIS — Z3A36 36 weeks gestation of pregnancy: Secondary | ICD-10-CM | POA: Diagnosis not present

## 2017-12-07 DIAGNOSIS — J45909 Unspecified asthma, uncomplicated: Secondary | ICD-10-CM | POA: Insufficient documentation

## 2017-12-07 DIAGNOSIS — O99013 Anemia complicating pregnancy, third trimester: Secondary | ICD-10-CM | POA: Insufficient documentation

## 2017-12-07 DIAGNOSIS — O99343 Other mental disorders complicating pregnancy, third trimester: Secondary | ICD-10-CM | POA: Insufficient documentation

## 2017-12-07 DIAGNOSIS — R1010 Upper abdominal pain, unspecified: Secondary | ICD-10-CM | POA: Insufficient documentation

## 2017-12-07 DIAGNOSIS — Z79899 Other long term (current) drug therapy: Secondary | ICD-10-CM | POA: Insufficient documentation

## 2017-12-07 DIAGNOSIS — Z882 Allergy status to sulfonamides status: Secondary | ICD-10-CM | POA: Diagnosis not present

## 2017-12-07 DIAGNOSIS — O26893 Other specified pregnancy related conditions, third trimester: Secondary | ICD-10-CM | POA: Insufficient documentation

## 2017-12-07 DIAGNOSIS — O1423 HELLP syndrome (HELLP), third trimester: Secondary | ICD-10-CM | POA: Diagnosis not present

## 2017-12-07 DIAGNOSIS — M549 Dorsalgia, unspecified: Secondary | ICD-10-CM | POA: Diagnosis present

## 2017-12-07 DIAGNOSIS — O09893 Supervision of other high risk pregnancies, third trimester: Secondary | ICD-10-CM | POA: Insufficient documentation

## 2017-12-07 DIAGNOSIS — O163 Unspecified maternal hypertension, third trimester: Secondary | ICD-10-CM | POA: Insufficient documentation

## 2017-12-07 DIAGNOSIS — D691 Qualitative platelet defects: Secondary | ICD-10-CM | POA: Diagnosis not present

## 2017-12-07 DIAGNOSIS — K8020 Calculus of gallbladder without cholecystitis without obstruction: Secondary | ICD-10-CM | POA: Insufficient documentation

## 2017-12-07 LAB — URINALYSIS, ROUTINE W REFLEX MICROSCOPIC
BILIRUBIN URINE: NEGATIVE
Glucose, UA: NEGATIVE mg/dL
Ketones, ur: NEGATIVE mg/dL
NITRITE: NEGATIVE
Protein, ur: NEGATIVE mg/dL
SPECIFIC GRAVITY, URINE: 1.014 (ref 1.005–1.030)
pH: 6 (ref 5.0–8.0)

## 2017-12-07 LAB — COMPREHENSIVE METABOLIC PANEL
ALBUMIN: 3.2 g/dL — AB (ref 3.5–5.0)
ALK PHOS: 122 U/L (ref 38–126)
ALT: 105 U/L — ABNORMAL HIGH (ref 14–54)
ANION GAP: 8 (ref 5–15)
AST: 128 U/L — ABNORMAL HIGH (ref 15–41)
BUN: 7 mg/dL (ref 6–20)
CALCIUM: 8.9 mg/dL (ref 8.9–10.3)
CO2: 21 mmol/L — AB (ref 22–32)
Chloride: 103 mmol/L (ref 101–111)
Creatinine, Ser: 0.5 mg/dL (ref 0.44–1.00)
GFR calc non Af Amer: >60 mL/min (ref >60)
Glucose, Bld: 78 mg/dL (ref 65–99)
POTASSIUM: 4.1 mmol/L (ref 3.5–5.1)
SODIUM: 132 mmol/L — AB (ref 135–145)
TOTAL PROTEIN: 6.5 g/dL (ref 6.5–8.1)
Total Bilirubin: 1 mg/dL (ref 0.3–1.2)

## 2017-12-07 LAB — CBC
HEMATOCRIT: 36.7 % (ref 36.0–46.0)
HEMOGLOBIN: 13.1 g/dL (ref 12.0–15.0)
MCH: 32.3 pg (ref 26.0–34.0)
MCHC: 35.7 g/dL (ref 30.0–36.0)
MCV: 90.6 fL (ref 78.0–100.0)
Platelets: 104 10*3/uL — ABNORMAL LOW (ref 150–400)
RBC: 4.05 MIL/uL (ref 3.87–5.11)
RDW: 12.9 % (ref 11.5–15.5)
WBC: 12.3 10*3/uL — ABNORMAL HIGH (ref 4.0–10.5)

## 2017-12-07 LAB — PROTEIN / CREATININE RATIO, URINE
CREATININE, URINE: 82 mg/dL
Protein Creatinine Ratio: 0.21 mg/mg{Cre} — ABNORMAL HIGH (ref 0.00–0.15)
TOTAL PROTEIN, URINE: 17 mg/dL

## 2017-12-07 LAB — AMYLASE: AMYLASE: 38 U/L (ref 28–100)

## 2017-12-07 LAB — LIPASE, BLOOD: LIPASE: 21 U/L (ref 11–51)

## 2017-12-07 MED ORDER — MAGNESIUM SULFATE BOLUS VIA INFUSION
6.0000 g | Freq: Once | INTRAVENOUS | Status: AC
  Administered 2017-12-07: 6 g via INTRAVENOUS
  Filled 2017-12-07: qty 500

## 2017-12-07 MED ORDER — HYDRALAZINE HCL 20 MG/ML IJ SOLN: 10.0000 mg | Freq: Once | INTRAMUSCULAR | Status: DC | PRN

## 2017-12-07 MED ORDER — FENTANYL CITRATE (PF) 100 MCG/2ML IJ SOLN
100.0000 ug | Freq: Once | INTRAMUSCULAR | Status: AC
  Administered 2017-12-07: 100 ug via INTRAVENOUS
  Filled 2017-12-07: qty 2

## 2017-12-07 MED ORDER — CEFAZOLIN SODIUM-DEXTROSE 2-4 GM/100ML-% IV SOLN: 2.0000 g | Freq: Once | INTRAVENOUS | Status: DC

## 2017-12-07 MED ORDER — OXYCODONE-ACETAMINOPHEN 5-325 MG PO TABS
2.0000 | ORAL_TABLET | Freq: Once | ORAL | Status: DC
  Filled 2017-12-07: qty 2

## 2017-12-07 MED ORDER — LABETALOL HCL 5 MG/ML IV SOLN
20.0000 mg | INTRAVENOUS | Status: DC | PRN
  Administered 2017-12-07: 40 mg via INTRAVENOUS
  Administered 2017-12-07: 20 mg via INTRAVENOUS
  Filled 2017-12-07: qty 4
  Filled 2017-12-07: qty 8
  Filled 2017-12-07: qty 16

## 2017-12-07 MED ORDER — MAGNESIUM SULFATE 40 G IN LACTATED RINGERS - SIMPLE
2.0000 g/h | INTRAVENOUS | Status: DC
  Administered 2017-12-07: 2 g/h via INTRAVENOUS
  Filled 2017-12-07: qty 40

## 2017-12-07 MED ORDER — LACTATED RINGERS IV SOLN
INTRAVENOUS | Status: DC
  Administered 2017-12-07: 19:00:00 via INTRAVENOUS

## 2017-12-07 NOTE — MAU Note (Signed)
Patient presents to MAU with c/o abdominal and back pain since last night.  Reports feeling nauseated and vomited x1 yesterday AM.  States she is supposed to be on special diet for "liver stones" and "completely forgot and ate fried food last night." Has had severe abdominal and back pain since then.  "Took 2 percocet last night and was able to get through the night, but feeling nauseated again today."  Plans to schedule induction  for next week to deliver at 37wks due to SGA.

## 2017-12-07 NOTE — MAU Provider Note (Signed)
Chief Complaint:  Back Pain; Abdominal Pain; and Nausea  First Provider Initiated Contact with Patient 12/07/17 1849      HPI: Isabel Hubbard is a 27 y.o. G1P0 at [redacted]w[redacted]d who presents to maternity admissions reporting upper abdominal pain, back pain, nausea and vomiting since yesterday. Doesn't think pain is R/T contractions. Hx chronic HTN that was not elevated this pregnancy until a few weeks ago. Was started on Labetalol 200 mg BID. Took dose this morning. Also has Hx of some kind of Liver "stones". Had Gall Stones in past and Cholecystectomy, but has continued to have same Sx. Sees GI for this problem. No records available. This episode of pain feels similar. Started after eating something fried that she is supposed to be avoiding. Pt states liver labwork has been normal in the past.   Being followed for fetal growth restriction. Plan delivery at 37 weeks.  Location: upper abd Quality: cramping Severity: 8/10 in pain scale Duration: 2 days Context: [redacted] weeks gestation. Hx liver problems w/ Nml labs per pt. Timing: constant w/ intermittent exacerbations Modifying factors: Partial relief w/ 2 Percocet last night. Didn't take any today. Associated signs and symptoms: Pos for nausea, mild HA (that she attributes to abd pain), vomited once yesterday. Denies fever, chills, vision changes, leakage of fluid or vaginal bleeding. Good fetal movement.    Past Medical History:  Diagnosis Date  . Allergy   . Anxiety   . Asthma   . Depression   . Hypertension    OB History  Gravida Para Term Preterm AB Living  1         0  SAB TAB Ectopic Multiple Live Births               # Outcome Date GA Lbr Len/2nd Weight Sex Delivery Anes PTL Lv  1 Current              Past Surgical History:  Procedure Laterality Date  . APPENDECTOMY    . CHOLECYSTECTOMY  12/27/14  . WISDOM TOOTH EXTRACTION     Family History  Problem Relation Age of Onset  . Cancer Mother 14       breast  . Hyperlipidemia  Father   . Cancer Maternal Grandmother 46       ovarian  . Stroke Maternal Grandfather   . Hyperlipidemia Paternal Grandmother   . Cancer Paternal Grandmother        lung  . Hyperlipidemia Paternal Grandfather   . COPD Paternal Grandfather    Social History   Tobacco Use  . Smoking status: Never Smoker  . Smokeless tobacco: Never Used  Substance Use Topics  . Alcohol use: No    Frequency: Never    Comment: rare  . Drug use: No   Allergies  Allergen Reactions  . Dexamethasone Rash  . Sulfa Antibiotics Rash  . Naproxen Other (See Comments)    GERD/Reflux  . Betadine [Povidone Iodine] Rash  . Prednisone Rash  . Tessalon Perles [Benzonatate] Rash   Medications Prior to Admission  Medication Sig Dispense Refill Last Dose  . acetaminophen (TYLENOL) 325 MG tablet Take 650 mg by mouth every 6 (six) hours as needed for mild pain or headache.   Past Week at Unknown time  . albuterol (VENTOLIN HFA) 108 (90 Base) MCG/ACT inhaler Inhale 2 puffs into the lungs every 4 (four) hours as needed for wheezing or shortness of breath. 1 Inhaler 1 11/25/2017 at Unknown time  . busPIRone (BUSPAR) 5 MG  tablet Take 5 mg by mouth 2 (two) times daily.   11/26/2017 at Unknown time  . butalbital-acetaminophen-caffeine (FIORICET, ESGIC) 50-325-40 MG tablet Take 1 tablet by mouth every 6 (six) hours as needed for headache or migraine.    Past Week at Unknown time  . doxylamine, Sleep, (UNISOM) 25 MG tablet Take 25 mg by mouth at bedtime as needed for sleep.   Past Week at Unknown time  . labetalol (NORMODYNE) 200 MG tablet Take 1 tablet (200 mg total) by mouth 2 (two) times daily. 60 tablet 3   . montelukast (SINGULAIR) 10 MG tablet Take 1 tablet (10 mg total) by mouth daily. 30 tablet 2 11/26/2017 at Unknown time  . pantoprazole (PROTONIX) 40 MG tablet Take 40 mg by mouth daily.   11/26/2017 at Unknown time  . Prenatal Vit-Fe Fumarate-FA (MULTIVITAMIN-PRENATAL) 27-0.8 MG TABS tablet Take 1 tablet by mouth at  bedtime.    11/26/2017 at Unknown time    I have reviewed patient's Past Medical Hx, Surgical Hx, Family Hx, Social Hx, medications and allergies.   ROS:  Review of Systems  Constitutional: Negative for chills and fever.  Eyes: Negative for visual disturbance.  Respiratory: Negative for shortness of breath.   Gastrointestinal: Positive for abdominal pain, nausea and vomiting. Negative for abdominal distention, constipation and diarrhea.  Genitourinary: Negative for vaginal bleeding and vaginal discharge.  Musculoskeletal: Positive for back pain.  Neurological: Positive for headaches.    Physical Exam   Patient Vitals for the past 24 hrs:  BP Temp Temp src Pulse Resp SpO2 Weight  12/07/17 2001 122/87 - - 70 - - -  12/07/17 1946 (!) 142/85 - - 67 - - -  12/07/17 1931 (!) 145/98 - - 70 - - -  12/07/17 1916 140/90 - - 64 - - -  12/07/17 1910 (!) 155/95 - - 62 - - -  12/07/17 1901 (!) 166/96 - - 62 - - -  12/07/17 1850 (!) 163/100 - - (!) 58 - - -  12/07/17 1830 (!) 172/98 - - 62 - 100 % -  12/07/17 1815 (!) 171/103 - - (!) 57 - 99 % -  12/07/17 1804 (!) 148/101 98.4 F (36.9 C) Oral 61 18 100 % -  12/07/17 1759 - - - - - - 169 lb 12 oz (77 kg)   Constitutional: Well-developed, well-nourished female in mild distress.  Cardiovascular: normal rate Respiratory: normal effort GI: Abd soft, non-tender, gravid S<D.  Neurologic: Alert and oriented x 4.  GU: Deferred  FHT:  Baseline 135 , moderate variability, 15x15 accelerations present, no decelerations Contractions: Irreg, mild   Labs: Results for orders placed or performed during the hospital encounter of 12/07/17 (from the past 24 hour(s))  Urinalysis, Routine w reflex microscopic     Status: Abnormal   Collection Time: 12/07/17  5:55 PM  Result Value Ref Range   Color, Urine YELLOW YELLOW   APPearance HAZY (A) CLEAR   Specific Gravity, Urine 1.014 1.005 - 1.030   pH 6.0 5.0 - 8.0   Glucose, UA NEGATIVE NEGATIVE mg/dL    Hgb urine dipstick SMALL (A) NEGATIVE   Bilirubin Urine NEGATIVE NEGATIVE   Ketones, ur NEGATIVE NEGATIVE mg/dL   Protein, ur NEGATIVE NEGATIVE mg/dL   Nitrite NEGATIVE NEGATIVE   Leukocytes, UA LARGE (A) NEGATIVE   RBC / HPF 0-5 0 - 5 RBC/hpf   WBC, UA 6-30 0 - 5 WBC/hpf   Bacteria, UA RARE (A) NONE SEEN   Squamous Epithelial /  LPF 6-30 (A) NONE SEEN   Mucus PRESENT   Protein / creatinine ratio, urine     Status: Abnormal   Collection Time: 12/07/17  5:55 PM  Result Value Ref Range   Creatinine, Urine 82.00 mg/dL   Total Protein, Urine 17 mg/dL   Protein Creatinine Ratio 0.21 (H) 0.00 - 0.15 mg/mg[Cre]  Comprehensive metabolic panel     Status: Abnormal   Collection Time: 12/07/17  6:40 PM  Result Value Ref Range   Sodium 132 (L) 135 - 145 mmol/L   Potassium 4.1 3.5 - 5.1 mmol/L   Chloride 103 101 - 111 mmol/L   CO2 21 (L) 22 - 32 mmol/L   Glucose, Bld 78 65 - 99 mg/dL   BUN 7 6 - 20 mg/dL   Creatinine, Ser 1.610.50 0.44 - 1.00 mg/dL   Calcium 8.9 8.9 - 09.610.3 mg/dL   Total Protein 6.5 6.5 - 8.1 g/dL   Albumin 3.2 (L) 3.5 - 5.0 g/dL   AST 045128 (H) 15 - 41 U/L   ALT 105 (H) 14 - 54 U/L   Alkaline Phosphatase 122 38 - 126 U/L   Total Bilirubin 1.0 0.3 - 1.2 mg/dL   GFR calc non Af Amer >60 >60 mL/min   GFR calc Af Amer >60 >60 mL/min   Anion gap 8 5 - 15  CBC     Status: Abnormal   Collection Time: 12/07/17  6:40 PM  Result Value Ref Range   WBC 12.3 (H) 4.0 - 10.5 K/uL   RBC 4.05 3.87 - 5.11 MIL/uL   Hemoglobin 13.1 12.0 - 15.0 g/dL   HCT 40.936.7 81.136.0 - 91.446.0 %   MCV 90.6 78.0 - 100.0 fL   MCH 32.3 26.0 - 34.0 pg   MCHC 35.7 30.0 - 36.0 g/dL   RDW 78.212.9 95.611.5 - 21.315.5 %   Platelets 104 (L) 150 - 400 K/uL  Amylase     Status: None   Collection Time: 12/07/17  6:40 PM  Result Value Ref Range   Amylase 38 28 - 100 U/L  Lipase, blood     Status: None   Collection Time: 12/07/17  6:40 PM  Result Value Ref Range   Lipase 21 11 - 51 U/L   MAU Course: Orders Placed This Encounter   Procedures  . Urinalysis, Routine w reflex microscopic  . Comprehensive metabolic panel  . CBC  . Protein / creatinine ratio, urine  . Amylase  . Lipase, blood  . Notify Physician  . Check blood pressure 20 minutes after giving hydrALAZINE 10 mg IV dose. Call MD if SBP >/= 160 and/or DBP >/= 110.  . Once BP goal is reached, repeat BP every 10 minutes for 1 hour, then every 15 minutes for 1 hours, then per policy for antepartum labor or post-partum.  . Insert peripheral IV   Meds ordered this encounter  Medications  . labetalol (NORMODYNE,TRANDATE) injection 20-80 mg  . hydrALAZINE (APRESOLINE) injection 10 mg  . lactated ringers infusion   Discussed Hx, labs, exam w/ Dr. Tiburcio BashAlmquist. Agrees w/ POC. New orders: Magnesium Sulfate. Will come see pt and discuss mode of delivery.   MDM: - HELLP Syndrome. Proceed w/ delivery. Dr. Amado NashAlmquist to discuss mode of delivery.   Assessment: 1. HELLP (hemolytic anemia/elev liver enzymes/low platelets in pregnancy), third trimester    Plan: Admit. Proceed w/ delivery.  Dr. Amado NashAlmquist en route to discuss mode of delivery.   Katrinka BlazingSmith, [place], [place] 12/07/2017 8:37 PM

## 2017-12-07 NOTE — H&P (Signed)
DORRIS PIERRE is a 27 y.o. G1P0 at [redacted]w[redacted]d gestation presents for complaint of upper abdominal pain, nausea today and feeling like she has the flu.  She has noted +fm, and occasional contractions during the day.  She denies any vaginal bleeding or LOF.  Patient denies any c/o headache, vision changes, RUQ pain.  Patien talso with h/o "liver stones" and has occasional abd pain (followed by GI) - this occurred last night but today's pain is different than pain she has had before.   Antepartum course: chronic hypertension and growth restriction ; pt with h/o chtn that was stable with normal blood pressures until about 2 weeks go when started on labetalol 200mg  po bid.  Course also complicated with growth restriction and growth u/s on 2/22 showed efw: 13% (3'13") with AC)%, dropping in growth from 40%.  Dopplers were also noted to be abnormal.  Antenatal testing has been reassuring, 3/4: bpp 8/8; weekly labs have been normal.  Patient with prednisone allergy and betamethazone not given.  Patient also with h/o anxiety.   [facility]  See complete pre-natal records  History OB History    Gravida Para Term Preterm AB Living   1         0   SAB TAB Ectopic Multiple Live Births                 Past Medical History:  Diagnosis Date  . Allergy   . Anxiety   . Asthma   . Depression   . Hypertension    Past Surgical History:  Procedure Laterality Date  . APPENDECTOMY    . CHOLECYSTECTOMY  12/27/14  . WISDOM TOOTH EXTRACTION     Family History: family history includes COPD in her paternal grandfather; Cancer in her paternal grandmother; Cancer (age of onset: 6) in her mother; Cancer (age of onset: 13) in her maternal grandmother; Hyperlipidemia in her father, paternal grandfather, and paternal grandmother; Stroke in her maternal grandfather. Social History:  reports that  has never smoked. she has never used smokeless tobacco. She reports that she does not drink  alcohol or use drugs.  ROS: See above otherwise negative  Prenatal labs:  ABO, Rh:  neg Antibody:  neg Rubella:  immune RPR:    neg HBsAg:   negative HIV:  neg GBS:   negative 1 hr Glucola: Normal Genetic screening: Normal (normal nt/ultrascreen, neg afp Anatomy US: Normal anatomy  Physical Exam:   Dilation: 1 Effacement (%): Thick Exam by:: Dr. Amado Nash Blood pressure 140/82, pulse 70, temperature 98.4 F (36.9 C), temperature source Oral, resp. rate 18, weight 169 lb 12 oz (77 kg), last menstrual period 03/30/2017, SpO2 100 %. A&O x 3 HEENT: Normal Lungs: CTAB CV: RRR Abdominal: Soft, Non-tender and Gravid Lower Extremities: Non-edematous, Non-tender  Pelvic Exam:      Dilatation: 1cm     Effacement: 20%     Station: High     Presentation: Cephalic  Labs:  CBC:  Lab Results  Component Value Date   WBC 12.3 (H) 12/07/2017   RBC 4.05 12/07/2017   HGB 13.1 12/07/2017   HCT 36.7 12/07/2017   MCV 90.6 12/07/2017   MCH 32.3 12/07/2017   MCHC 35.7 12/07/2017   RDW 12.9 12/07/2017   PLT 104 (L) 12/07/2017   CMP:  Lab Results  Component Value Date   NA 132 (L) 12/07/2017   K 4.1 12/07/2017   CL 103 12/07/2017   CO2 21 (L)  12/07/2017   GLUCOSE 78 12/07/2017   BUN 7 12/07/2017   CREATININE 0.50 12/07/2017   CALCIUM 8.9 12/07/2017   PROT 6.5 12/07/2017   AST 128 (H) 12/07/2017   ALT 105 (H) 12/07/2017   ALBUMIN 3.2 (L) 12/07/2017   ALKPHOS 122 12/07/2017   BILITOT 1.0 12/07/2017   GFRNONAA >60 12/07/2017   GFRAA >60 12/07/2017   ANIONGAP 8 12/07/2017   Urine: Lab Results  Component Value Date   COLORURINE YELLOW 12/07/2017   APPEARANCEUR HAZY (A) 12/07/2017   LABSPEC 1.014 12/07/2017   PHURINE 6.0 12/07/2017   GLUCOSEU NEGATIVE 12/07/2017   HGBUR SMALL (A) 12/07/2017   BILIRUBINUR NEGATIVE 12/07/2017   KETONESUR NEGATIVE 12/07/2017   PROTEINUR NEGATIVE 12/07/2017   NITRITE NEGATIVE 12/07/2017   LEUKOCYTESUR LARGE (A) 12/07/2017      Prenatal Transfer Tool  Maternal Diabetes: No Genetic Screening: Normal Maternal Ultrasounds/Referrals: Abnormal:  Findings:   IUGR Fetal Ultrasounds or other Referrals:  Referred to Materal Fetal Medicine  Maternal Substance Abuse:  No Significant Maternal Medications:  None Significant Maternal Lab Results: Lab values include: Other:  gbs negative  FHT: 120s, normal variability then with decreased variability, +accels and no decels [place]; initiall rare, occasional, now about q 5, irregular  Assessment/Plan:  27 y.o. G1P0 at 5728w0d gestation with HELLP  1. I have reviewed diagnosis with patient and recommendation for delivery.  [facility] Dr. Tona Sensingevonzo I have spoken with them the recommendation for transfer to outlying hospital for delivery and NICU care.  I have spoken with Dr. Lauris PoagMelissa Kozakiewicz ([facility], [place]) and have reviewed the patient and with her and she has accepted transfer.  No further recommendations given.  Pt has been given 6Gram bolus of magnesium sulfate and now receiving 2 gram/hr.  She is s/p iv labetalol and responded well - now mild range;  2. Fetal status - reassuring though now with decreased variability after starting magnesium sulfate 3. iugr - efw 13%, AC 0%, abnormal dopplers 4. gbs neg 5. Contractions - not in active labor 6. Rh negative 7. H/o CHTN - labetalol on 200mg  po bid At this time fetal status is reassuring and maternal status is stable for transfer.  Vick FreesSusan E Destina Mantei 12/07/2017, 9:19 PM

## 2017-12-08 LAB — TYPE AND SCREEN
ABO/RH(D): O NEG
ANTIBODY SCREEN: POSITIVE

## 2018-01-28 ENCOUNTER — Encounter: Payer: Self-pay | Admitting: Physician Assistant

## 2018-01-28 ENCOUNTER — Ambulatory Visit
Admission: RE | Admit: 2018-01-28 | Discharge: 2018-01-28 | Disposition: A | Discharge status: Home / Self Care | Payer: BC Managed Care – PPO | Source: Ambulatory Visit | Attending: Physician Assistant | Admitting: Physician Assistant

## 2018-01-28 ENCOUNTER — Ambulatory Visit: Payer: BC Managed Care – PPO | Admitting: Physician Assistant

## 2018-01-28 DIAGNOSIS — R51 Headache: Secondary | ICD-10-CM | POA: Diagnosis not present

## 2018-01-28 DIAGNOSIS — R519 Headache, unspecified: Secondary | ICD-10-CM

## 2018-01-28 MED ORDER — VENLAFAXINE HCL ER 37.5 MG PO CP24: 37.5000 mg | ORAL_CAPSULE | Freq: Two times a day (BID) | ORAL | 0 refills | Status: DC

## 2018-01-28 MED ORDER — METOCLOPRAMIDE HCL 5 MG PO TABS: 5.0000 mg | ORAL_TABLET | Freq: Three times a day (TID) | ORAL | 2 refills | Status: DC | PRN

## 2018-01-28 MED ORDER — ALPRAZOLAM ER 0.5 MG PO TB24: 0.5000 mg | ORAL_TABLET | Freq: Every day | ORAL | 2 refills | Status: DC

## 2018-01-28 MED ORDER — SUMATRIPTAN SUCCINATE 100 MG PO TABS: 100.0000 mg | ORAL_TABLET | Freq: Once | ORAL | 2 refills | Status: DC | PRN

## 2018-01-28 NOTE — Patient Instructions (Signed)
If any worsening symptoms go to the ER Will go to the ER if worsening headache, changes vision/speech, imbalance, weakness.   Start effexor once daily for 3-5 days and can increase to the 2 a day I fyou do well with this I can send in the 75mg , we can go up to 150mg  of this medication. .  Take the sumatriptan and reglan at the same time, can repeat in 2 hours if HA is not better  Best thing for migraine prevention is 500mg -1000 mg of magnesium at night.   To prevent migraines you can try these natural/OTC medications as prevention: 1) Melatonin 5mg -15mg  30 mins before bed 2) Riboflavin/B2 400mg  daily 3) CoQ10 100mg  3 x a day  We may also treat TMJ if we think you have it If you are having frequent migraines we may put you on a once a day medication with fast acting medication to take. Also there is such a thing called rebound headache from over use of acute medications.  Please do not use rescue or acute medications more than 10 days a month or more than 3 days per week, this can cause a withdrawal and a rebound headache.  Here is more information below  Please remember, common headache triggers are: sleep deprivation, dehydration, overheating, stress, hypoglycemia or skipping meals and blood sugar fluctuations, excessive pain medications or excessive alcohol use or caffeine withdrawal. Some people have food triggers such as aged cheese, orange juice or chocolate, especially dark chocolate, or MSG (monosodium glutamate). Try to avoid these headache triggers as much possible.   It may be helpful to keep a headache diary to figure out what makes your headaches worse or brings them on and what alleviates them. Some people report headache onset after exercise but studies have shown that regular exercise may actually prevent headaches from coming. If you have exercise-induced headaches, please make sure that you drink plenty of fluid before and after exercising and that you do not over do it and  do not overheat.   Please go to the ER if there is weakness, thunderclap headache, visual changes, or any concerning factors    Migraine Headache A migraine headache is an intense, throbbing pain on one or both sides of your head. Recurrent migraines keep coming back. A migraine can last for 30 minutes to several hours. CAUSES  The exact cause of a migraine headache is not always known. However, a migraine may be caused when nerves in the brain become irritated and release chemicals that cause inflammation. This causes pain. Certain things may also trigger migraines, such as:   Alcohol.  Smoking.  Stress.  Menstruation.  Aged cheeses.  Foods or drinks that contain nitrates, glutamate, aspartame, or tyramine.  Lack of sleep.  Chocolate.  Caffeine.  Hunger.  Physical exertion.  Fatigue.  Medicines used to treat chest pain (nitroglycerine), birth control pills, estrogen, and some blood pressure medicines. SYMPTOMS   Pain on one or both sides of your head.  Pulsating or throbbing pain.  Severe pain that prevents daily activities.  Pain that is aggravated by any physical activity.  Nausea, vomiting, or both.  Dizziness.  Pain with exposure to bright lights, loud noises, or activity.  General sensitivity to bright lights, loud noises, or smells. Before you get a migraine, you may get warning signs that a migraine is coming (aura). An aura may include:  Seeing flashing lights.  Seeing bright spots, halos, or zigzag lines.  Having tunnel vision or blurred vision.  Having feelings of numbness or tingling.  Having trouble talking.  Having muscle weakness. DIAGNOSIS  A recurrent migraine headache is often diagnosed based on:  Symptoms.  Physical examination.  A CT scan or MRI of your head. These imaging tests cannot diagnose migraines but can help rule out other causes of headaches.  TREATMENT  Medicines may be given for pain and nausea. Medicines  can also be given to help prevent recurrent migraines. HOME CARE INSTRUCTIONS  Only take over-the-counter or prescription medicines for pain or discomfort as directed by your health care provider. The use of long-term narcotics is not recommended.  Lie down in a dark, quiet room when you have a migraine.  Keep a journal to find out what may trigger your migraine headaches. For example, write down:  What you eat and drink.  How much sleep you get.  Any change to your diet or medicines.  Limit alcohol consumption.  Quit smoking if you smoke.  Get 7-9 hours of sleep, or as recommended by your health care provider.  Limit stress.  Keep lights dim if bright lights bother you and make your migraines worse. SEEK MEDICAL CARE IF:   You do not get relief from the medicines given to you.  You have a recurrence of pain.  You have a fever. SEEK IMMEDIATE MEDICAL CARE IF:  Your migraine becomes severe.  You have a stiff neck.  You have loss of vision.  You have muscular weakness or loss of muscle control.  You start losing your balance or have trouble walking.  You feel faint or pass out. You have severe symptoms that are different from your first symptoms. MAKE SURE YOU:   Understand these instructions.  Will watch your condition.  Will get help right away if you are not doing well or get worse.   This information is not intended to replace advice given to you by your health care provider. Make sure you discuss any questions you have with your health care provider.   Document Released: 06/16/2001 Document Revised: 10/12/2014 Document Reviewed: 05/29/2013 Elsevier Interactive Patient Education 2016 [employer].  Common Migraine Triggers   Foods Aged cheese, alcohol, nuts, chocolate, yogurt, onions, figs, liver, caffeinated foods and beverages, monosodium glutamate (MSG), smoked or pickled fish/meat, nitrate/nitrate preserved foods (hotdogs, pepperoni, salami)  tyramine  Medications Antibiotics (tetracycline, griseofulvin), antihypertensives (nifedipine, captopril), hormones (oral contraceptives, estrogens), histamine-2 blockers (cimetidine, raniidine, vasodilators (nitroglycerine, isosorbide dinitrate)  Sensory Stimuli Flickering/bright/fluorescent lights, bright sunlight, odors (perfume, chemicals, cigarette smoke)  Lifestyle Changes Time zones, sleep patterns, eating habits, caffeine withdrawal stress  Other Menstrual cycle, weather/season/air pressure changes, high altitude  Adapted from [place], [place]. Clin. J. Med. 1995; [place] and [place]. Conquering Headache, 1998  Hormonal variations also are believed to play a part.  Fluctuations of the female hormone estrogen (such as just before menstruation) affect a chemical called serotonin-when serotonin levels in the brain fall, the dilation (expansion) of blood vessels in the brain that is characteristic of migraine often follows.  Many factors or "triggers" can start a migraine.  In people who get migraines, most experts think certain activities or foods may trigger temporary changes in the blood vessels around the brain.  Swelling of these blood vessels may cause pain in the nearby nerves.  Allergy Headaches:  Hotdogs Milk  Onions  Thyme Bacon  Chocolate Garlic  Nutmeg Ham  Dark Cola Pork  Cinnamon Salami  Nuts  Egg  Ginger Sausage Red wine Cloves  Cheddar Cheese Caffeine

## 2018-01-28 NOTE — Progress Notes (Signed)
Subjective:    Patient ID: Isabel Hubbard, female    DOB: Dec 02, 1990, 27 y.o.   MRN: [id number]  HPI 27 y.o. WF with history of HTN on labetolol, migraines 2 months post partum (0306/2019), baby [name]) delivered 36 weeks due to HELLP syndrome presents with migraine. She is NOT breast feeding. She has a history of migraines but they have been worse over the past month.  She had labs repeated at GYN 04/15 with normal CMET and CBC.   She has had 3 migraines in the past month. Tuesday night was the worst, she had HA at 3 AM. She states fiorcet helps but she has to take 2 and it makes her BCP void. She has been on maxalt in the past but it does not help. She was on zoloft in the past but would like to try something different for anxiety, in addition she just started back to school and work, has 6 weeks until summer break- requesting xanax for during the time.   Always behind an eye, throbbing pain, photo and phonophobia with nausea. Tuesday was "worse HA" ever, still have the HA, with dizziness and nausea still, no changes in vision.   Will follow up with GI for AB pain.   Blood pressure 120/70, pulse 75, temperature 97.9 F (36.6 C), resp. rate 16, weight 149 lb 12.8 oz (67.9 kg), last menstrual period 03/30/2017, SpO2 98 %, unknown if currently breastfeeding.  Medications Current Outpatient Medications on File Prior to Visit  Medication Sig  . albuterol (VENTOLIN HFA) 108 (90 Base) MCG/ACT inhaler Inhale 2 puffs into the lungs every 4 (four) hours as needed for wheezing or shortness of breath.  . busPIRone (BUSPAR) 5 MG tablet Take 5 mg by mouth 2 (two) times daily.  . butalbital-acetaminophen-caffeine (FIORICET, ESGIC) 50-325-40 MG tablet Take 1 tablet by mouth every 6 (six) hours as needed for headache or migraine.   . montelukast (SINGULAIR) 10 MG tablet Take 1 tablet (10 mg total) by mouth daily.  . pantoprazole (PROTONIX) 40 MG tablet Take 40 mg by mouth daily.   No current  facility-administered medications on file prior to visit.     Problem list She has Asthma; Near syncope; and Morning sickness on their problem list.   Review of Systems  Constitutional: Negative.  Negative for chills and fever.  HENT: Negative for tinnitus.   Eyes: Positive for photophobia and visual disturbance. Negative for pain, discharge, redness and itching.  Respiratory: Negative.   Cardiovascular: Negative.   Gastrointestinal: Positive for nausea.  Genitourinary: Negative.   Musculoskeletal: Negative.  Negative for arthralgias, neck pain and neck stiffness.  Neurological: Positive for dizziness and headaches. Negative for tremors, seizures, syncope, facial asymmetry, speech difficulty, weakness, light-headedness and numbness.  Psychiatric/Behavioral: Negative.        Objective:   Physical Exam  Constitutional: She is oriented to person, place, and time. She appears well-developed and well-nourished.  HENT:  Head: Normocephalic and atraumatic.  Right Ear: External ear normal.  Left Ear: External ear normal.  Mouth/Throat: Oropharynx is clear and moist.  Eyes: Pupils are equal, round, and reactive to light. Conjunctivae and EOM are normal.  Neck: Normal range of motion. Neck supple. No thyromegaly present.  Cardiovascular: Normal rate, regular rhythm and normal heart sounds. Exam reveals no gallop and no friction rub.  No murmur heard. Pulmonary/Chest: Effort normal and breath sounds normal. No respiratory distress. She has no wheezes.  Abdominal: Soft. Bowel sounds are normal. She exhibits no distension  and no mass. There is no tenderness. There is no rebound and no guarding.  Musculoskeletal: Normal range of motion.  Lymphadenopathy:    She has no cervical adenopathy.  Neurological: She is alert and oriented to person, place, and time. She displays normal reflexes. No cranial nerve deficit. Coordination normal.  Skin: Skin is warm and dry.  Psychiatric: She has a normal  mood and affect.       Assessment & Plan:    Acute nonintractable headache, unspecified headache type -     CT Head Wo Contrast; Future STAT With worse HA ever, continuing HA, dizziness, and nausea with recent HELLP syndrome will get CT scan rule out SDH Otherwise likely migraines She is back on BCP, will do acute therapy and try effexor as prevention -     venlafaxine XR (EFFEXOR XR) 37.5 MG 24 hr capsule; Take 1 capsule (37.5 mg total) by mouth 2 (two) times daily. -     metoCLOPramide (REGLAN) 5 MG tablet; Take 1 tablet (5 mg total) by mouth every 8 (eight) hours as needed for nausea (migraines). -     SUMAtriptan (IMITREX) 100 MG tablet; Take 1 tablet (100 mg total) by mouth once as needed for migraine. May repeat in 2 hours if headache persists or recurs. -     ALPRAZolam (XANAX XR) 0.5 MG 24 hr tablet; Take 1 tablet (0.5 mg total) by mouth daily.

## 2018-02-05 ENCOUNTER — Encounter: Payer: Self-pay | Admitting: Physician Assistant

## 2018-02-16 ENCOUNTER — Other Ambulatory Visit: Payer: Self-pay | Admitting: Student

## 2018-02-20 ENCOUNTER — Other Ambulatory Visit: Payer: Self-pay | Admitting: Physician Assistant

## 2018-02-21 ENCOUNTER — Other Ambulatory Visit: Payer: Self-pay | Admitting: Student

## 2018-02-22 ENCOUNTER — Encounter (INDEPENDENT_AMBULATORY_CARE_PROVIDER_SITE_OTHER): Payer: Self-pay

## 2018-04-06 ENCOUNTER — Other Ambulatory Visit: Payer: Self-pay | Admitting: *Deleted

## 2018-04-06 DIAGNOSIS — J209 Acute bronchitis, unspecified: Secondary | ICD-10-CM

## 2018-04-06 MED ORDER — ALBUTEROL SULFATE HFA 108 (90 BASE) MCG/ACT IN AERS: 2.0000 | INHALATION_SPRAY | RESPIRATORY_TRACT | 0 refills | Status: AC | PRN

## 2018-04-26 ENCOUNTER — Other Ambulatory Visit: Payer: Self-pay | Admitting: Gastroenterology

## 2018-04-26 DIAGNOSIS — R1011 Right upper quadrant pain: Secondary | ICD-10-CM

## 2018-04-26 DIAGNOSIS — R112 Nausea with vomiting, unspecified: Secondary | ICD-10-CM

## 2018-04-30 ENCOUNTER — Ambulatory Visit
Admission: RE | Admit: 2018-04-30 | Discharge: 2018-04-30 | Disposition: A | Discharge status: Home / Self Care | Payer: BC Managed Care – PPO | Source: Ambulatory Visit | Attending: Gastroenterology | Admitting: Gastroenterology

## 2018-04-30 DIAGNOSIS — R1011 Right upper quadrant pain: Secondary | ICD-10-CM

## 2018-04-30 DIAGNOSIS — R112 Nausea with vomiting, unspecified: Secondary | ICD-10-CM

## 2018-04-30 MED ORDER — GADOBENATE DIMEGLUMINE 529 MG/ML IV SOLN
13.0000 mL | Freq: Once | INTRAVENOUS | Status: AC | PRN
  Administered 2018-04-30: 13 mL via INTRAVENOUS

## 2018-07-10 MED ORDER — PANTOPRAZOLE SODIUM 40 MG PO TBEC: 40.0000 mg | DELAYED_RELEASE_TABLET | Freq: Every day | ORAL | 1 refills | Status: DC

## 2018-07-25 DIAGNOSIS — G43909 Migraine, unspecified, not intractable, without status migrainosus: Secondary | ICD-10-CM | POA: Insufficient documentation

## 2018-07-25 NOTE — Progress Notes (Signed)
Assessment and Plan:  Isabel Hubbard was seen today for headache.  Diagnoses and all orders for this visit:  Hypertension, unspecified type Continue medication: labetalol 200 mg BID Monitor blood pressure at home; call if consistently over 130/80 Continue DASH diet.   Reminder to go to the ER if any CP, SOB, nausea, dizziness, severe HA, changes vision/speech, left arm numbness and tingling and jaw pain.  Anxiety Continue current dose of effexor pending addition of trazodone, consider increasing dose to 150 mg daily  Start new medication as prescribed Stress management techniques discussed, increase water, good sleep hygiene discussed, increase exercise, and increase veggies.   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 Discussed med's effects and SE's.   Over 30 minutes of exam, counseling, chart review, and critical decision making was performed.   No future appointments.  ------------------------------------------------------------------------------------------------------------------  HPI 27 y.o.female hx of htn, anxiety, migraines, new to our office this year, new to this provider presents for evaluation of anxiety, insomnia, and migraines; she reports she has had 2 migraines in past 3 months that prompted her to present to urgent care for IM headache cocktail. She reports prior to these episodes, she had a particularly difficult time sleeping, only  got 2 hours, etc the nights before. She reports anxiety is improved on effexor XR 75 mg after dose increase.   She was last seen by our office in 01/28/2018 for similar complaint, had reported just having had "worst HA ever" - CT head w/o contrast was ordered without remarkable findings. Describes headaches as always behind an eye, throbbing pain, photo and phonophobia with nausea. She is currently on labetalol 200 mg BID for BP and prophylaxis, and has restarted on lo loestrin which controlled her migraines well prior to recent pregnancy (had daughter Isabel Hubbard in March 2019, she is not breastfeeding, delivered at 36 weeks due to HELLP syndrome, CMET/CBC were rechecked by GYN in 01/17/2018 and were normal).  She has imitrex 100 mg tab as abortive, but found this to be ineffective as abortive for recent migraine episodes, which she attributes to stress/lack of sleep.    Past Medical History:  Diagnosis Date  . Allergy   . Anxiety   . Asthma   . Depression   . Hypertension      Allergies  Allergen Reactions  . Dexamethasone Rash  . Sulfa Antibiotics Rash  . Naproxen Other (See Comments)    GERD/Reflux  . Betadine [Povidone Iodine] Rash  . Prednisone Rash  . Tessalon Perles [Benzonatate] Rash    Current Outpatient Medications on File Prior to Visit  Medication Sig  . albuterol (VENTOLIN HFA) 108 (90 Base) MCG/ACT inhaler Inhale 2 puffs into the lungs every 4 (four) hours as needed for wheezing or shortness of breath.  . ALPRAZolam (XANAX XR) 0.5 MG 24 hr tablet Take 1 tablet (0.5 mg total) by mouth daily.  . colestipol (COLESTID) 1 g tablet Take 1 g by mouth daily.  Marland Kitchen labetalol (NORMODYNE) 200 MG  tablet Take 200 mg by mouth 2 (two) times daily.  Isabel Hubbard Estrad-Fe Biphas (LO LOESTRIN FE PO) Take by mouth.  . pantoprazole (PROTONIX) 40 MG tablet Take 1 tablet (40 mg total) by mouth daily.  . SUMAtriptan (IMITREX) 100 MG tablet Take 1 tablet (100 mg total) by mouth once as needed for  migraine. May repeat in 2 hours if headache persists or recurs.  . venlafaxine XR (EFFEXOR-XR) 37.5 MG 24 hr capsule TAKE 1 CAPSULE (37.5 MG TOTAL) BY MOUTH 2 (TWO) TIMES DAILY. (Patient taking differently: Take 75 mg by mouth daily. )  . montelukast (SINGULAIR) 10 MG tablet Take 1 tablet (10 mg total) by mouth daily.   No current facility-administered medications on file prior to visit.     ROS: all negative except above.   Physical Exam:  BP 130/80   Pulse 70   Temp 97.7 F (36.5 C)   Wt 152 lb (68.9 kg)   SpO2 98%   BMI 23.11 kg/m   General Appearance: Well nourished, in no apparent distress. Eyes: PERRLA, EOMs, conjunctiva no swelling or erythema Sinuses: No Frontal/maxillary tenderness ENT/Mouth: Ext aud canals clear, TMs without erythema, bulging. No erythema, swelling, or exudate on post pharynx.  Tonsils not swollen or erythematous. Hearing normal.  Neck: Supple, thyroid normal.  Respiratory: Respiratory effort normal, BS equal bilaterally without rales, rhonchi, wheezing or stridor.  Cardio: RRR with no MRGs. Brisk peripheral pulses without edema.  Abdomen: Soft, + BS.  Non tender, no guarding, rebound, hernias, masses. Lymphatics: Non tender without lymphadenopathy.  Musculoskeletal: Full ROM, 5/5 strength, normal gait.  Skin: Warm, dry without rashes, lesions, ecchymosis.  Neuro: Cranial nerves intact. Normal muscle tone, no cerebellar symptoms. Sensation intact.  Psych: Awake and oriented X 3, normal affect, Insight and Judgment appropriate.     Dan Maker, NP 5:26 PM [facility]

## 2018-07-26 ENCOUNTER — Encounter: Payer: Self-pay | Admitting: Adult Health

## 2018-07-26 ENCOUNTER — Ambulatory Visit (INDEPENDENT_AMBULATORY_CARE_PROVIDER_SITE_OTHER): Payer: BC Managed Care – PPO | Admitting: Adult Health

## 2018-07-26 VITALS — BP 130/80 | HR 70 | Temp 97.7°F | Wt 152.0 lb

## 2018-07-26 DIAGNOSIS — F419 Anxiety disorder, unspecified: Secondary | ICD-10-CM | POA: Diagnosis not present

## 2018-07-26 DIAGNOSIS — I10 Essential (primary) hypertension: Secondary | ICD-10-CM

## 2018-07-26 DIAGNOSIS — G4700 Insomnia, unspecified: Secondary | ICD-10-CM

## 2018-07-26 DIAGNOSIS — G43919 Migraine, unspecified, intractable, without status migrainosus: Secondary | ICD-10-CM

## 2018-07-26 MED ORDER — TRAZODONE HCL 50 MG PO TABS: ORAL_TABLET | ORAL | 2 refills | Status: DC

## 2018-07-26 NOTE — Patient Instructions (Signed)
Insomnia Insomnia is frequent trouble falling and/or staying asleep. Insomnia can be a long term problem or a short term problem. Both are common. Insomnia can be a short term problem when the wakefulness is related to a certain stress or worry. Long term insomnia is often related to ongoing stress during waking hours and/or poor sleeping habits. Overtime, sleep deprivation itself can make the problem worse. Every little thing feels more severe because you are overtired and your ability to cope is decreased. CAUSES   Stress, anxiety, and depression.  Poor sleeping habits.  Distractions such as TV in the bedroom.  Naps close to bedtime.  Engaging in emotionally charged conversations before bed.  Technical reading before sleep.  Alcohol and other sedatives. They may make the problem worse. They can hurt normal sleep patterns and normal dream activity.  Stimulants such as caffeine for several hours prior to bedtime.  Pain syndromes and shortness of breath can cause insomnia.  Exercise late at night.  Changing time zones may cause sleeping problems (jet lag). It is sometimes helpful to have someone observe your sleeping patterns. They should look for periods of not breathing during the night (sleep apnea). They should also look to see how long those periods last. If you live alone or observers are uncertain, you can also be observed at a sleep clinic where your sleep patterns will be professionally monitored. Sleep apnea requires a checkup and treatment. Give your caregivers your medical history. Give your caregivers observations your family has made about your sleep.  SYMPTOMS   Not feeling rested in the morning.  Anxiety and restlessness at bedtime.  Difficulty falling and staying asleep. TREATMENT   Your caregiver may prescribe treatment for an underlying medical disorders. Your caregiver can give advice or help if you are using alcohol or other drugs for self-medication.  Treatment of underlying problems will usually eliminate insomnia problems.  Medications can be prescribed for short time use. They are generally not recommended for lengthy use.  Over-the-counter sleep medicines are not recommended for lengthy use. They can be habit forming.  You can promote easier sleeping by making lifestyle changes such as:  Using relaxation techniques that help with breathing and reduce muscle tension.  Exercising earlier in the day.  Changing your diet and the time of your last meal. No night time snacks.  Establish a regular time to go to bed.  Counseling can help with stressful problems and worry.  Soothing music and white noise may be helpful if there are background noises you cannot remove.  Stop tedious detailed work at least one hour before bedtime. HOME CARE INSTRUCTIONS   Keep a diary. Inform your caregiver about your progress. This includes any medication side effects. See your caregiver regularly. Take note of:  Times when you are asleep.  Times when you are awake during the night.  The quality of your sleep.  How you feel the next day. This information will help your caregiver care for you.  Get out of bed if you are still awake after 15 minutes. Read or do some quiet activity. Keep the lights down. Wait until you feel sleepy and go back to bed.  Keep regular sleeping and waking hours. Avoid naps.  Exercise regularly.  Avoid distractions at bedtime. Distractions include watching television or engaging in any intense or detailed activity like attempting to balance the household checkbook.  Develop a bedtime ritual. Keep a familiar routine of bathing, brushing your teeth, climbing into bed at  the same time each night, listening to soothing music. Routines increase the success of falling to sleep faster.  Use relaxation techniques. This can be using breathing and muscle tension release routines. It can also include visualizing peaceful scenes.  You can also help control troubling or intruding thoughts by keeping your mind occupied with boring or repetitive thoughts like the old concept of counting sheep. You can make it more creative like imagining planting one beautiful flower after another in your backyard garden.  During your day, work to eliminate stress. When this is not possible use some of the previous suggestions to help reduce the anxiety that accompanies stressful situations. MAKE SURE YOU:   Understand these instructions.  Will watch your condition.  Will get help right away if you are not doing well or get worse. Document Released: 09/18/2000 Document Revised: 12/14/2011 Document Reviewed: 10/19/2007 [facility] Patient Information 2015 [place], [place]. This information is not intended to replace advice given to you by your health care provider. Make sure you discuss any questions you have with your health care provider.    Trazodone tablets What is this medicine? TRAZODONE (TRAZ oh done) is used to treat depression. This medicine may be used for other purposes; ask your health care provider or pharmacist if you have questions. COMMON BRAND NAME(S): Desyrel What should I tell my health care provider before I take this medicine? They need to know if you have any of these conditions: -attempted suicide or thinking about it -bipolar disorder -bleeding problems -glaucoma -heart disease, or previous heart attack -irregular heart beat -kidney or liver disease -low levels of sodium in the blood -an unusual or allergic reaction to trazodone, other medicines, foods, dyes or preservatives -pregnant or trying to get pregnant -breast-feeding How should I use this medicine? Take this medicine by mouth with a glass of water. Follow the directions on the prescription label. Take this medicine shortly after a meal or a light snack. Take your medicine at regular intervals. Do not take your medicine more often than directed. Do not  stop taking this medicine suddenly except upon the advice of your doctor. Stopping this medicine too quickly may cause serious side effects or your condition may worsen. A special MedGuide will be given to you by the pharmacist with each prescription and refill. Be sure to read this information carefully each time. Talk to your pediatrician regarding the use of this medicine in children. Special care may be needed. Overdosage: If you think you have taken too much of this medicine contact a poison control center or emergency room at once. NOTE: This medicine is only for you. Do not share this medicine with others. What if I miss a dose? If you miss a dose, take it as soon as you can. If it is almost time for your next dose, take only that dose. Do not take double or extra doses. What may interact with this medicine? Do not take this medicine with any of the following medications: -certain medicines for fungal infections like fluconazole, itraconazole, ketoconazole, posaconazole, voriconazole -cisapride -dofetilide -dronedarone -linezolid -MAOIs like Carbex, Eldepryl, Marplan, Nardil, and Parnate -mesoridazine -methylene blue (injected into a vein) -pimozide -saquinavir -thioridazine -ziprasidone This medicine may also interact with the following medications: -alcohol -antiviral medicines for HIV or AIDS -aspirin and aspirin-like medicines -barbiturates like phenobarbital -certain medicines for blood pressure, heart disease, irregular heart beat -certain medicines for depression, anxiety, or psychotic disturbances -certain medicines for migraine headache like almotriptan, eletriptan, frovatriptan, naratriptan, rizatriptan, sumatriptan, zolmitriptan -  certain medicines for seizures like carbamazepine and phenytoin -certain medicines for sleep -certain medicines that treat or prevent blood clots like dalteparin, enoxaparin, warfarin -digoxin -fentanyl -lithium -NSAIDS, medicines for  pain and inflammation, like ibuprofen or naproxen -other medicines that prolong the QT interval (cause an abnormal heart rhythm) -rasagiline -supplements like St. John's wort, kava kava, valerian -tramadol -tryptophan This list may not describe all possible interactions. Give your health care provider a list of all the medicines, herbs, non-prescription drugs, or dietary supplements you use. Also tell them if you smoke, drink alcohol, or use illegal drugs. Some items may interact with your medicine. What should I watch for while using this medicine? Tell your doctor if your symptoms do not get better or if they get worse. Visit your doctor or health care professional for regular checks on your progress. Because it may take several weeks to see the full effects of this medicine, it is important to continue your treatment as prescribed by your doctor. Patients and their families should watch out for new or worsening thoughts of suicide or depression. Also watch out for sudden changes in feelings such as feeling anxious, agitated, panicky, irritable, hostile, aggressive, impulsive, severely restless, overly excited and hyperactive, or not being able to sleep. If this happens, especially at the beginning of treatment or after a change in dose, call your health care professional. Bonita Quin may get drowsy or dizzy. Do not drive, use machinery, or do anything that needs mental alertness until you know how this medicine affects you. Do not stand or sit up quickly, especially if you are an older patient. This reduces the risk of dizzy or fainting spells. Alcohol may interfere with the effect of this medicine. Avoid alcoholic drinks. This medicine may cause dry eyes and blurred vision. If you wear contact lenses you may feel some discomfort. Lubricating drops may help. See your eye doctor if the problem does not go away or is severe. Your mouth may get dry. Chewing sugarless gum, sucking hard candy and drinking plenty  of water may help. Contact your doctor if the problem does not go away or is severe. What side effects may I notice from receiving this medicine? Side effects that you should report to your doctor or health care professional as soon as possible: -allergic reactions like skin rash, itching or hives, swelling of the face, lips, or tongue -elevated mood, decreased need for sleep, racing thoughts, impulsive behavior -confusion -fast, irregular heartbeat -feeling faint or lightheaded, falls -feeling agitated, angry, or irritable -loss of balance or coordination -painful or prolonged erections -restlessness, pacing, inability to keep still -suicidal thoughts or other mood changes -tremors -trouble sleeping -seizures -unusual bleeding or bruising Side effects that usually do not require medical attention (report to your doctor or health care professional if they continue or are bothersome): -change in sex drive or performance -change in appetite or weight -constipation -headache -muscle aches or pains -nausea This list may not describe all possible side effects. Call your doctor for medical advice about side effects. You may report side effects to FDA at 1-800-FDA-1088. Where should I keep my medicine? Keep out of the reach of children. Store at room temperature between 15 and 30 degrees C (59 to 86 degrees F). Protect from light. Keep container tightly closed. Throw away any unused medicine after the expiration date. NOTE: This sheet is a summary. It may not cover all possible information. If you have questions about this medicine, talk to your doctor, pharmacist, or health  care provider.  2018 Elsevier/Gold Standard (2016-02-20 16:57:05)    Please be aware that some of the medications that you are on can sometimes cause a rare and potentially dangerous adverse reaction, called SEROTONIN SYNDROME: Symptoms of this condition include (but are not limited to):  Agitation or restlessness,  confusion, rapid heart rate and high blood pressure, dilated pupils, loss of muscle coordination or twitching muscles, muscle rigidity/stiffness, sweating and/or flushing, diarrhea, headache, shivering, goose bumps. If you have any of these symptoms you may have to stop the medication. Call your health care provider immediately.  Severe serotonin syndrome can be life-threatening emergency. Signs and symptoms of a severe reaction may include: high fever, seizures, irregular heartbeat, unconsciousness or altered level of awareness or personality changes.  If you have any of these new symptoms, call 911 or have someone take you to the emergency room.

## 2018-07-28 ENCOUNTER — Other Ambulatory Visit: Payer: Self-pay | Admitting: Adult Health

## 2018-07-28 MED ORDER — NADOLOL 80 MG PO TABS: ORAL_TABLET | ORAL | 1 refills | Status: DC

## 2018-08-17 ENCOUNTER — Other Ambulatory Visit: Payer: Self-pay | Admitting: Adult Health

## 2018-08-30 ENCOUNTER — Encounter: Payer: Self-pay | Admitting: Adult Health

## 2018-08-30 ENCOUNTER — Other Ambulatory Visit: Payer: Self-pay | Admitting: Adult Health

## 2018-08-30 DIAGNOSIS — R61 Generalized hyperhidrosis: Secondary | ICD-10-CM

## 2018-09-05 ENCOUNTER — Other Ambulatory Visit: Payer: Self-pay | Admitting: Physician Assistant

## 2018-09-12 ENCOUNTER — Telehealth: Payer: Self-pay | Admitting: Physician Assistant

## 2018-09-13 NOTE — Telephone Encounter (Signed)
Error

## 2018-09-14 ENCOUNTER — Other Ambulatory Visit: Payer: Self-pay | Admitting: Adult Health

## 2018-09-14 MED ORDER — BUSPIRONE HCL 10 MG PO TABS: ORAL_TABLET | ORAL | 1 refills | Status: DC

## 2018-09-20 ENCOUNTER — Ambulatory Visit: Payer: BC Managed Care – PPO | Admitting: Adult Health

## 2018-09-20 ENCOUNTER — Encounter: Payer: Self-pay | Admitting: Adult Health

## 2018-09-20 VITALS — BP 120/78 | HR 74 | Temp 98.1°F | Wt 158.6 lb

## 2018-09-20 DIAGNOSIS — J060 Acute laryngopharyngitis: Secondary | ICD-10-CM | POA: Diagnosis not present

## 2018-09-20 LAB — POCT RAPID STREP A (OFFICE): Rapid Strep A Screen: NEGATIVE

## 2018-09-20 MED ORDER — AMOXICILLIN-POT CLAVULANATE 875-125 MG PO TABS: 1.0000 | ORAL_TABLET | Freq: Two times a day (BID) | ORAL | 0 refills | Status: AC

## 2018-09-20 MED ORDER — PREDNISONE 20 MG PO TABS: ORAL_TABLET | ORAL | 0 refills | Status: DC

## 2018-09-20 MED ORDER — PROMETHAZINE-DM 6.25-15 MG/5ML PO SYRP: 5.0000 mL | ORAL_SOLUTION | Freq: Four times a day (QID) | ORAL | 1 refills | Status: DC | PRN

## 2018-09-20 NOTE — Progress Notes (Signed)
Assessment and Plan:  Isabel Hubbard was seen today for sore throat, adenopathy and generalized body aches.  Diagnoses and all orders for this visit:  Acute laryngopharyngitis Pharyngitis: take medicationss as prescribed, increase fluids,  Salt water gargles. If symptoms do not improve in 5-7 days or get worse contact the office or go to ER. -     POCT rapid strep A - neg -     predniSONE (DELTASONE) 20 MG tablet; 2 tablets daily for 3 days, 1 tablet daily for 4 days. -     amoxicillin-clavulanate (AUGMENTIN) 875-125 MG tablet; Take 1 tablet by mouth 2 (two) times daily for 10 days. -     promethazine-dextromethorphan (PROMETHAZINE-DM) 6.25-15 MG/5ML syrup; Take 5 mLs by mouth 4 (four) times daily as needed for cough.   Further disposition pending results of labs. Discussed med's effects and SE's.   Over 15 minutes of exam, counseling, chart review, and critical decision making was performed.   Future Appointments  Date Time Provider Department Center  02/08/2019  3:45 PM Judd Gaudier, NP [facility] None    ------------------------------------------------------------------------------------------------------------------   HPI BP 120/78   Pulse 74   Temp 98.1 F (36.7 C)   Wt 158 lb 9.6 oz (71.9 kg)   SpO2 98%   BMI 24.12 kg/m   27 y.o.female with hx of allergies and asthma presents for evaluation of sore throat; she reports sore throat began 3 days ago Saturday evening, worse today, with throat tenderness, hurts to swallow, body aches began today. She reports 99.3, 99.7 temp yesterday and today. Denies significant cough or congestion. Denies headache, rash, fatigue, dyspnea, nausea/vomiting/diarrhea, dyspnea, chest pains, palpitations.   She is a Tourist information centre manager, several cases of strep/flu  Prior to onset her 20 month old infant had URI symptoms  She is on singulair, albuterol inhaler PRN, did take ibuprofen last night for throat pain   Past Medical History:  Diagnosis  Date  . Allergy   . Anxiety   . Asthma   . Depression   . Hypertension      Allergies  Allergen Reactions  . Dexamethasone Rash  . Sulfa Antibiotics Rash  . Naproxen Other (See Comments)    GERD/Reflux  . Betadine [Povidone Iodine] Rash  . Prednisone Rash  . Tessalon Perles [Benzonatate] Rash    Current Outpatient Medications on File Prior to Visit  Medication Sig  . albuterol (VENTOLIN HFA) 108 (90 Base) MCG/ACT inhaler Inhale 2 puffs into the lungs every 4 (four) hours as needed for wheezing or shortness of breath.  . ALPRAZolam (XANAX XR) 0.5 MG 24 hr tablet TAKE 1 TABLET BY MOUTH EVERY DAY  . busPIRone (BUSPAR) 10 MG tablet Take 1/2-1 tab 2-3 times daily as directed for anxiety.  . nadolol (CORGARD) 80 MG tablet Start by taking 40 mg (1/2 tab) daily; titrate up to full tab in 2 weeks. For blood pressure and migraine prophylaxis.  Isabel Hubbard Estrad-Fe Biphas (LO LOESTRIN FE PO) Take by mouth.  . pantoprazole (PROTONIX) 40 MG tablet Take 1 tablet (40 mg total) by mouth daily.  . SUMAtriptan (IMITREX) 100 MG tablet Take 1 tablet (100 mg total) by mouth once as needed for migraine. May repeat in 2 hours if headache persists or recurs.  . traZODone (DESYREL) 50 MG tablet TAKE 1/2 TO 1 TABLET BY MOUTH FOR SLEEP  . venlafaxine XR (EFFEXOR-XR) 37.5 MG 24 hr capsule TAKE 1 CAPSULE (37.5 MG TOTAL) BY MOUTH 2 (TWO) TIMES DAILY. (Patient taking differently: Take 150  mg by mouth daily. )  . montelukast (SINGULAIR) 10 MG tablet Take 1 tablet (10 mg total) by mouth daily.   No current facility-administered medications on file prior to visit.     ROS: all negative except above.   Physical Exam:  BP 120/78   Pulse 74   Temp 98.1 F (36.7 C)   Wt 158 lb 9.6 oz (71.9 kg)   SpO2 98%   BMI 24.12 kg/m   General Appearance: Well nourished, in no apparent distress. Eyes: PERRLA, EOMs, conjunctiva no swelling or erythema Sinuses: No Frontal/maxillary tenderness ENT/Mouth: Ext aud  canals clear, TMs without erythema, bulging. Post pharynx mildly erythematous, without notable swelling, or exudate on post pharynx.  Tonsils not swollen or erythematous. Hearing normal.  Neck: Supple, thyroid normal.  Respiratory: Respiratory effort normal, BS equal bilaterally without rales, rhonchi, wheezing or stridor.  Cardio: RRR with no MRGs. Brisk peripheral pulses without edema.  Abdomen: Soft, + BS.  Non tender. Lymphatics: Tenderness bilaterally/upper without lymphadenopathy.  Musculoskeletal: normal gait.  Skin: Warm, dry without rashes, lesions, ecchymosis.  Neuro: Cranial nerves intact. Normal muscle tone Psych: Awake and oriented X 3, normal affect, Insight and Judgment appropriate.     Dan MakerAshley C Aralyn Nowak, NP 2:45 PM [facility]

## 2018-09-20 NOTE — Patient Instructions (Signed)

## 2018-09-26 ENCOUNTER — Other Ambulatory Visit: Payer: Self-pay | Admitting: Adult Health

## 2018-09-26 MED ORDER — FLUCONAZOLE 150 MG PO TABS: 150.0000 mg | ORAL_TABLET | Freq: Once | ORAL | 3 refills | Status: AC

## 2018-09-29 ENCOUNTER — Other Ambulatory Visit: Payer: Self-pay | Admitting: Internal Medicine

## 2018-09-29 DIAGNOSIS — F419 Anxiety disorder, unspecified: Secondary | ICD-10-CM

## 2018-09-29 MED ORDER — VENLAFAXINE HCL ER 150 MG PO CP24: ORAL_CAPSULE | ORAL | 1 refills | Status: DC

## 2018-10-12 ENCOUNTER — Other Ambulatory Visit: Payer: Self-pay | Admitting: Adult Health

## 2018-10-18 ENCOUNTER — Encounter: Payer: Self-pay | Admitting: Adult Health

## 2018-10-25 ENCOUNTER — Ambulatory Visit: Payer: BC Managed Care – PPO | Admitting: Adult Health Nurse Practitioner

## 2018-10-25 ENCOUNTER — Encounter: Payer: Self-pay | Admitting: Adult Health Nurse Practitioner

## 2018-10-25 VITALS — BP 116/80 | HR 61 | Temp 97.7°F | Ht 68.0 in | Wt 160.4 lb

## 2018-10-25 DIAGNOSIS — J039 Acute tonsillitis, unspecified: Secondary | ICD-10-CM | POA: Diagnosis not present

## 2018-10-25 DIAGNOSIS — J020 Streptococcal pharyngitis: Secondary | ICD-10-CM

## 2018-10-25 DIAGNOSIS — J029 Acute pharyngitis, unspecified: Secondary | ICD-10-CM

## 2018-10-25 LAB — POCT RAPID STREP A (OFFICE): Rapid Strep A Screen: POSITIVE — AB

## 2018-10-25 MED ORDER — FLUCONAZOLE 150 MG PO TABS: ORAL_TABLET | ORAL | 0 refills | Status: DC

## 2018-10-25 MED ORDER — PENICILLIN V POTASSIUM 500 MG PO TABS: 500.0000 mg | ORAL_TABLET | Freq: Two times a day (BID) | ORAL | 0 refills | Status: DC

## 2018-10-25 NOTE — Patient Instructions (Addendum)
We will send in Penicillin antibiotic and Diflucan to help prevent yeast   Drink warm/cold liquids Gargle salt water and spit out You can return to work 48hours after starting antibiotics OR 24hours after fever has resolved.  We will do a referral for ENT for evaluation of recurrent tonsilitis    Strep Throat  Strep throat is an infection of the throat. It is caused by germs. Strep throat spreads from person to person because of coughing, sneezing, or close contact. Follow these instructions at home: Medicines  Take over-the-counter and prescription medicines only as told by your doctor.  Take your antibiotic medicine as told by your doctor. Do not stop taking the medicine even if you feel better.  Have family members who also have a sore throat or fever go to a doctor. Eating and drinking  Do not share food, drinking cups, or personal items.  Try eating soft foods until your sore throat feels better.  Drink enough fluid to keep your pee (urine) clear or pale yellow. General instructions  Rinse your mouth (gargle) with a salt-water mixture 3-4 times per day or as needed. To make a salt-water mixture, stir -1 tsp of salt into 1 cup of warm water.  Make sure that all people in your house wash their hands well.  Rest.  Stay home from school or work until you have been taking antibiotics for 24 hours.  Keep all follow-up visits as told by your doctor. This is important. Contact a doctor if:  Your neck keeps getting bigger.  You get a rash, cough, or earache.  You cough up thick liquid that is green, yellow-brown, or bloody.  You have pain that does not get better with medicine.  Your problems get worse instead of getting better.  You have a fever. Get help right away if:  You throw up (vomit).  You get a very bad headache.  You neck hurts or it feels stiff.  You have chest pain or you are short of breath.  You have drooling, very bad throat pain, or changes  in your voice.  Your neck is swollen or the skin gets red and tender.  Your mouth is dry or you are peeing less than normal.  You keep feeling more tired or it is hard to wake up.  Your joints are red or they hurt. This information is not intended to replace advice given to you by your health care provider. Make sure you discuss any questions you have with your health care provider. Document Released: 03/09/2008 Document Revised: 05/20/2016 Document Reviewed: 01/14/2015 Elsevier Interactive Patient Education  2019 [employer] Tonsillitis  Tonsillitis is an infection of the throat that causes the tonsils to become red, tender, and swollen. Tonsils are tissues in the back of your throat. Each tonsil has crevices (crypts). Tonsils normally work to protect the body from infection. What are the causes? Sudden (acute) tonsillitis may be caused by a virus or bacteria, including streptococcal bacteria. Long-lasting (chronic) tonsillitis occurs when the crypts of the tonsils become filled with pieces of food and bacteria, which makes it easy for the tonsils to become repeatedly infected. Tonsillitis can be spread from person to person (is contagious). It may be spread by inhaling droplets that are released with coughing or sneezing. You may also come into contact with viruses or bacteria on surfaces, such as cups or utensils. What are the signs or symptoms? Symptoms of this condition include:  A sore throat. This may include trouble swallowing.  White patches on the tonsils.  Swollen tonsils.  Fever.  Headache.  Tiredness.  Loss of appetite.  Snoring during sleep when you did not snore before.  Small, foul-smelling, yellowish-white pieces of material (tonsilloliths) that you occasionally cough up or spit out. These can cause you to have bad breath. How is this diagnosed? This condition is diagnosed with a physical exam. Diagnosis can be confirmed with the results of lab tests,  including a throat culture. How is this treated? Treatment for this condition depends on the cause, but usually focuses on treating the symptoms associated with it. Treatment may include:  Medicines to relieve pain and manage fever.  Steroid medicines to reduce swelling.  Antibiotic medicines if the condition is caused by bacteria. If attacks of tonsillitis are severe and frequent, your health care provider may recommend surgery to remove the tonsils (tonsillectomy). Follow these instructions at home: Medicines  Take over-the-counter and prescription medicines only as told by your health care provider.  If you were prescribed an antibiotic medicine, take it as told by your health care provider. Do not stop taking the antibiotic even if you start to feel better. Eating and drinking  Drink enough fluid to keep your urine clear or pale yellow.  While your throat is sore, eat soft or liquid foods, such as sherbet, soups, or instant breakfast drinks.  Drink warm liquids.  Eat frozen ice pops. General instructions  Rest as much as possible and get plenty of sleep.  Gargle with a salt-water mixture 3-4 times a day or as needed. To make a salt-water mixture, completely dissolve -1 tsp of salt in 1 cup of warm water.  Wash your hands regularly with soap and water. If soap and water are not available, use hand sanitizer.  Do not share cups, bottles, or other utensils until your symptoms have gone away.  Do not smoke. This can help your symptoms and prevent the infection from coming back. If you need help quitting, ask your health care provider.  Keep all follow-up visits as told by your health care provider. This is important. Contact a health care provider if:  You notice large, tender lumps in your neck that were not there before.  You have a fever that does not go away after 2-3 days.  You develop a rash.  You cough up a green, yellow-brown, or bloody substance.  You cannot  swallow liquids or food for 24 hours.  Only one of your tonsils is swollen. Get help right away if:  You develop any new symptoms, such as vomiting, severe headache, stiff neck, chest pain, trouble breathing, or trouble swallowing.  You have severe throat pain along with drooling or voice changes.  You have severe pain that is not controlled with medicines.  You cannot fully open your mouth.  You develop redness, swelling, or severe pain anywhere in your neck. Summary  Tonsillitis is an infection of the throat that causes the tonsils to become red, tender, and swollen.  Tonsillitis may be caused by a virus or bacteria.  Rest as much as possible. Get plenty of sleep. This information is not intended to replace advice given to you by your health care provider. Make sure you discuss any questions you have with your health care provider. Document Released: 07/01/2005 Document Revised: 10/27/2016 Document Reviewed: 10/27/2016 Elsevier Interactive Patient Education  2019 [employer]

## 2018-10-25 NOTE — Progress Notes (Addendum)
Assessment and Plan:  Isabel Hubbard was seen today for acute visit, sore throat and fever.  Diagnoses and all orders for this visit:  Sore throat -     POCT rapid strep A, Positive Drink warm/cold liquids Gargle salt water and spit out You can return to work 48hours after starting antibiotics OR 24hours after fever has resolved.  Strep pharyngitis -     penicillin v potassium (VEETID) 500 MG tablet; Take 1 tablet (500 mg total) by mouth 2 (two) times daily. -     fluconazole (DIFLUCAN) 150 MG tablet; Take one tablet by mouth at onset of yeast symptoms.  Take second tablet on day three. Discussed hand hygiene and cleaning shared surfaces to prevent spread.  Tonsillitis We will place a referral for ENT evaluation related to recurrent tonsilitis / Strep infections.  Provided work note to be out for next 48 hours and may return once afebrile for 24 hours.  Call or return with new or worsening symptoms as discussed in appointment.  May contact via office phone [phone] or via MyChart.   Further disposition pending results of labs. Discussed med's effects and SE's.   Over 30 minutes of exam, counseling, chart review, and critical decision making was performed.   Future Appointments  Date Time Provider Department Center  11/03/2018  3:45 PM Judd Gaudier, NP [facility] None  02/08/2019  3:45 PM Judd Gaudier, NP [facility] None    ------------------------------------------------------------------------------------------------------------------   HPI 28 y.o.female presents for URI.  Reports that her symptoms started one week ago with cold sores in her mouth whaich she used mouth wash to resolve them.  She immediatley started a sore throat, tender to swallow with swollen lymph nodes.  She has taken 600mg  ibuprofen every 6 hours for past three days.  Reports low grade fever for the past two days.  Her ears have been popping and has not taken anything for this.  She denies any cough, N/V/D,  chest pains or shortness of breath.  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  She reports that she seeks treatment 4-6 episodes a year of sore throat and tonsillitis.  Last visit was 09/20/18 with treatment with antibiotics and prednisone.   Past Medical History:  Diagnosis Date  . Allergy   . Anxiety   . Asthma   . Depression   . Hypertension      Allergies  Allergen Reactions  . Dexamethasone Rash  . Sulfa Antibiotics Rash  . Naproxen Other (See Comments)    GERD/Reflux  . Betadine [Povidone Iodine] Rash  . Prednisone Rash  . Tessalon Perles [Benzonatate] Rash    Current Outpatient Medications on File Prior to Visit  Medication Sig  . albuterol (VENTOLIN HFA) 108 (90 Base) MCG/ACT inhaler Inhale 2 puffs into the lungs every 4 (four) hours as needed for wheezing or shortness of breath.  . ALPRAZolam (XANAX XR) 0.5 MG 24 hr tablet TAKE 1 TABLET BY MOUTH EVERY DAY  . busPIRone (BUSPAR) 10 MG tablet TAKE 1/2-1 TAB 2-3 TIMES DAILY AS DIRECTED FOR ANXIETY.  . nadolol (CORGARD) 80 MG tablet Start by taking 40 mg (1/2 tab) daily; titrate up to full tab in 2 weeks. For blood pressure and migraine prophylaxis.  [name] Estrad-Fe Biphas (LO LOESTRIN FE PO) Take by mouth.  . pantoprazole (PROTONIX) 40 MG tablet Take 1 tablet (40 mg total) by mouth daily.  . SUMAtriptan (IMITREX) 100 MG tablet Take 1 tablet (100 mg total) by mouth once as needed  for migraine. May repeat in 2 hours if headache persists or recurs.  . traZODone (DESYREL) 50 MG tablet TAKE 1/2 TO 1 TABLET BY MOUTH FOR SLEEP  . venlafaxine XR (EFFEXOR-XR) 150 MG 24 hr capsule Take 1 capsule daily for Mood  . montelukast (SINGULAIR) 10 MG tablet Take 1 tablet (10 mg total) by mouth daily.   No current facility-administered medications on file prior to visit.     ROS: Review of Systems  Constitutional: Positive for chills and fever. Negative for diaphoresis, malaise/fatigue and weight loss.  HENT:  Negative for congestion, ear discharge, ear pain, hearing loss, nosebleeds, sinus pain, sore throat and tinnitus.   Eyes: Negative for blurred vision, double vision, photophobia, pain, discharge and redness.  Respiratory: Negative for cough, hemoptysis, sputum production, shortness of breath, wheezing and stridor.   Cardiovascular: Negative for chest pain, palpitations, orthopnea, claudication, leg swelling and PND.  Gastrointestinal: Negative for abdominal pain, blood in stool, constipation, diarrhea, heartburn, melena, nausea and vomiting.  Genitourinary: Negative for dysuria, flank pain, frequency, hematuria and urgency.  Musculoskeletal: Negative for myalgias and neck pain.  Skin: Negative for itching and rash.  Neurological: Negative for dizziness and headaches.     Physical Exam:  BP 116/80   Pulse 61   Temp 97.7 F (36.5 C)   Ht 5\' 8"  (1.727 m)   Wt 160 lb 6.4 oz (72.8 kg)   SpO2 99%   BMI 24.39 kg/m   General Appearance: Well nourished, in no apparent distress. Eyes: PERRLA, EOMs, conjunctiva no swelling or erythema Sinuses: No Frontal/maxillary tenderness ENT/Mouth: Ext aud canals clear, TMs without erythema, bulging.  Post pharynx erythema noted.  Tonsils erythematous +2 with exudate patches biaterally. Hearing normal.  Neck: Supple, thyroid normal. Respiratory: Respiratory effort normal, BS equal bilaterally without rales, rhonchi, wheezing or stridor.  Cardio: RRR with no MRGs. Brisk peripheral pulses without edema.  Lymphatics: Cervical lymph adenopathy with tenderness. Remaining lymph non tender without lymphadenopathy.  Musculoskeletal: Full ROM, 5/5 strength, normal gait.  Skin: Warm, dry without rashes, lesions, ecchymosis.  Psych: Awake and oriented X 3, normal affect, Insight and Judgment appropriate.     Elder Negus, NP 11:36 AM [facility]

## 2018-10-26 ENCOUNTER — Encounter: Payer: Self-pay | Admitting: Adult Health Nurse Practitioner

## 2018-10-27 ENCOUNTER — Telehealth: Payer: Self-pay

## 2018-10-27 NOTE — Telephone Encounter (Signed)
Patient was seen on 10/25/18 and was prescribed antibiotics. Complaining of rash on chin, neck, chest and stomach.

## 2018-10-28 NOTE — Telephone Encounter (Signed)
Patient states that she is not taking Augmentin but Penicillin. She is allergic to prednisone. States that the Augmentin did work before but gave her the option of trying Doxycyline instead. Please advise on how to proceed.

## 2018-10-28 NOTE — Telephone Encounter (Signed)
Patient notified and is feeling better, taking Benadryl consistently which seems to be working and will add Penicillin to allergy list

## 2018-10-29 NOTE — Addendum Note (Signed)
Addended byElder Negus: Astin Sayre A on: 10/29/2018 10:59 AM   Modules accepted: Orders

## 2018-10-29 NOTE — Addendum Note (Signed)
Addended byElder Negus: Jewelz Kobus A on: 10/29/2018 10:05 AM   Modules accepted: Orders

## 2018-10-31 ENCOUNTER — Other Ambulatory Visit: Payer: Self-pay | Admitting: Adult Health Nurse Practitioner

## 2018-10-31 MED ORDER — DOXYCYCLINE HYCLATE 100 MG PO TBEC: 100.0000 mg | DELAYED_RELEASE_TABLET | Freq: Two times a day (BID) | ORAL | 0 refills | Status: AC

## 2018-10-31 MED ORDER — FLUCONAZOLE 150 MG PO TABS: ORAL_TABLET | ORAL | 0 refills | Status: DC

## 2018-11-03 ENCOUNTER — Ambulatory Visit: Payer: Self-pay | Admitting: Adult Health

## 2018-11-17 ENCOUNTER — Ambulatory Visit: Payer: BC Managed Care – PPO | Admitting: Adult Health

## 2018-11-17 ENCOUNTER — Encounter: Payer: Self-pay | Admitting: Adult Health

## 2018-11-17 VITALS — BP 110/72 | HR 62 | Temp 97.5°F | Ht 68.0 in | Wt 158.0 lb

## 2018-11-17 DIAGNOSIS — L03032 Cellulitis of left toe: Secondary | ICD-10-CM

## 2018-11-17 DIAGNOSIS — L600 Ingrowing nail: Secondary | ICD-10-CM

## 2018-11-17 MED ORDER — CEPHALEXIN 500 MG PO CAPS: 500.0000 mg | ORAL_CAPSULE | Freq: Three times a day (TID) | ORAL | 0 refills | Status: DC

## 2018-11-17 NOTE — Patient Instructions (Signed)
Soak daily in epsom salt foot bath   Monitor for worsening infection   Call if grows out ok vs if needing podiatry referral     Ingrown Toenail An ingrown toenail occurs when the corner or sides of a toenail grow into the surrounding skin. This causes discomfort and pain. The big toe is most commonly affected, but any of the toes can be affected. If an ingrown toenail is not treated, it can become infected. What are the causes? This condition may be caused by:  Wearing shoes that are too small or tight.  An injury, such as stubbing your toe or having your toe stepped on.  Improper cutting or care of your toenails.  Having nail or foot abnormalities that were present from birth (congenital abnormalities), such as having a nail that is too big for your toe. What increases the risk? The following factors may make you more likely to develop ingrown toenails:  Age. Nails tend to get thicker with age, so ingrown nails are more common among older people.  Cutting your toenails incorrectly, such as cutting them very short or cutting them unevenly. An ingrown toenail is more likely to get infected if you have:  Diabetes.  Blood flow (circulation) problems. What are the signs or symptoms? Symptoms of an ingrown toenail may include:  Pain, soreness, or tenderness.  Redness.  Swelling.  Hardening of the skin that surrounds the toenail. Signs that an ingrown toenail may be infected include:  Fluid or pus.  Symptoms that get worse instead of better. How is this diagnosed? An ingrown toenail may be diagnosed based on your medical history, your symptoms, and a physical exam. If you have fluid or blood coming from your toenail, a sample may be collected to test for the specific type of bacteria that is causing the infection. How is this treated? Treatment depends on how severe your ingrown toenail is. You may be able to care for your toenail at home.  If you have an infection, you  may be prescribed antibiotic medicines.  If you have fluid or pus draining from your toenail, your health care provider may drain it.  If you have trouble walking, you may be given crutches to use.  If you have a severe or infected ingrown toenail, you may need a procedure to remove part or all of the nail. Follow these instructions at home: Foot care   Do not pick at your toenail or try to remove it yourself.  Soak your foot in warm, soapy water. Do this for 20 minutes, 3 times a day, or as often as told by your health care provider. This helps to keep your toe clean and keep your skin soft.  Wear shoes that fit well and are not too tight. Your health care provider may recommend that you wear open-toed shoes while you heal.  Trim your toenails regularly and carefully. Cut your toenails straight across to prevent injury to the skin at the corners of the toenail. Do not cut your nails in a curved shape.  Keep your feet clean and dry to help prevent infection. Medicines  Take over-the-counter and prescription medicines only as told by your health care provider.  If you were prescribed an antibiotic, take it as told by your health care provider. Do not stop taking the antibiotic even if you start to feel better. Activity  Return to your normal activities as told by your health care provider. Ask your health care provider what activities are  safe for you.  Avoid activities that cause pain. General instructions  If your health care provider told you to use crutches to help you move around, use them as instructed.  Keep all follow-up visits as told by your health care provider. This is important. Contact a health care provider if:  You have more redness, swelling, pain, or other symptoms that do not improve with treatment.  You have fluid, blood, or pus coming from your toenail. Get help right away if:  You have a red streak on your skin that starts at your foot and spreads up  your leg.  You have a fever. Summary  An ingrown toenail occurs when the corner or sides of a toenail grow into the surrounding skin. This causes discomfort and pain. The big toe is most commonly affected, but any of the toes can be affected.  If an ingrown toenail is not treated, it can become infected.  Fluid or pus draining from your toenail is a sign of infection. Your health care provider may need to drain it. You may be given antibiotics to treat the infection.  Trimming your toenails regularly and properly can help you prevent an ingrown toenail. This information is not intended to replace advice given to you by your health care provider. Make sure you discuss any questions you have with your health care provider. Document Released: 09/18/2000 Document Revised: 06/09/2017 Document Reviewed: 06/09/2017 Elsevier Interactive Patient Education  2019 Elsevier Inc.    Cephalexin tablets or capsules What is this medicine? CEPHALEXIN (sef a LEX in) is a cephalosporin antibiotic. It is used to treat certain kinds of bacterial infections It will not work for colds, flu, or other viral infections. This medicine may be used for other purposes; ask your health care provider or pharmacist if you have questions. COMMON BRAND NAME(S): Biocef, Daxbia, Keflex, Keftab What should I tell my health care provider before I take this medicine? They need to know if you have any of these conditions: -kidney disease -stomach or intestine problems, especially colitis -an unusual or allergic reaction to cephalexin, other cephalosporins, penicillins, other antibiotics, medicines, foods, dyes or preservatives -pregnant or trying to get pregnant -breast-feeding How should I use this medicine? Take this medicine by mouth with a full glass of water. Follow the directions on the prescription label. This medicine can be taken with or without food. Take your medicine at regular intervals. Do not take your medicine  more often than directed. Take all of your medicine as directed even if you think you are better. Do not skip doses or stop your medicine early. Talk to your pediatrician regarding the use of this medicine in children. While this drug may be prescribed for selected conditions, precautions do apply. Overdosage: If you think you have taken too much of this medicine contact a poison control center or emergency room at once. NOTE: This medicine is only for you. Do not share this medicine with others. What if I miss a dose? If you miss a dose, take it as soon as you can. If it is almost time for your next dose, take only that dose. Do not take double or extra doses. There should be at least 4 to 6 hours between doses. What may interact with this medicine? -probenecid -some other antibiotics This list may not describe all possible interactions. Give your health care provider a list of all the medicines, herbs, non-prescription drugs, or dietary supplements you use. Also tell them if you smoke, drink alcohol,  or use illegal drugs. Some items may interact with your medicine. What should I watch for while using this medicine? Tell your doctor or health care professional if your symptoms do not begin to improve in a few days. Do not treat diarrhea with over the counter products. Contact your doctor if you have diarrhea that lasts more than 2 days or if it is severe and watery. If you have diabetes, you may get a false-positive result for sugar in your urine. Check with your doctor or health care professional. What side effects may I notice from receiving this medicine? Side effects that you should report to your doctor or health care professional as soon as possible: -allergic reactions like skin rash, itching or hives, swelling of the face, lips, or tongue -breathing problems -pain or trouble passing urine -redness, blistering, peeling or loosening of the skin, including inside the mouth -severe or watery  diarrhea -unusually weak or tired -yellowing of the eyes, skin Side effects that usually do not require medical attention (report to your doctor or health care professional if they continue or are bothersome): -gas or heartburn -genital or anal irritation -headache -joint or muscle pain -nausea, vomiting This list may not describe all possible side effects. Call your doctor for medical advice about side effects. You may report side effects to FDA at 1-800-FDA-1088. Where should I keep my medicine? Keep out of the reach of children. Store at room temperature between 59 and 86 degrees F (15 and 30 degrees C). Throw away any unused medicine after the expiration date. NOTE: This sheet is a summary. It may not cover all possible information. If you have questions about this medicine, talk to your doctor, pharmacist, or health care provider.  2019 Elsevier/Gold Standard (2007-12-26 17:09:13)

## 2018-11-17 NOTE — Progress Notes (Signed)
Assessment and Plan:  Isabel Hubbard was seen today for ingrown toenail.  Diagnoses and all orders for this visit:  Paronychia of great toe, left/ Ingrown toenail ABX, soak with epson, if not better follow up in the office or can refer to podiatry  Discussed monitor for worsening infection, gently express any purulent material after soaks Monitor as nail grows, if can identify edge, slowly lift after soaks, as nail grows out cut horizontally only to avoid edge growing into tissue, avoid cutting too short, avoid tight shoes Information provided on AVS -     cephALEXin (KEFLEX) 500 MG capsule; Take 1 capsule (500 mg total) by mouth 3 (three) times daily.   Further disposition pending results of labs. Discussed med's effects and SE's.   Over 15 minutes of exam, counseling, chart review, and critical decision making was performed.   Future Appointments  Date Time Provider Department Center  02/08/2019  3:45 PM Judd Gaudier, NP [facility] None    ------------------------------------------------------------------------------------------------------------------   HPI BP 110/72   Pulse 62   Temp (!) 97.5 F (36.4 C)   Ht 5\' 8"  (1.727 m)   Wt 158 lb (71.7 kg)   SpO2 97%   BMI 24.02 kg/m   28 y.o.female presents for evaluation of ingrown toenail of L foot, 1st digit. She reports lateral edge was growing into tissue, cut at an angle at home herself a few weeks ago, and has gradually developed an infection of tissue. She has been able to express some purulent material a few days ago. She has been monitoring, keeping clean, and applying neosporin.   Past Medical History:  Diagnosis Date  . Allergy   . Anxiety   . Asthma   . Depression   . Hypertension      Allergies  Allergen Reactions  . Dexamethasone Rash  . Sulfa Antibiotics Rash  . Naproxen Other (See Comments)    GERD/Reflux  . Betadine [Povidone Iodine] Rash  . Penicillins Rash  . Prednisone Rash  . Tessalon Perles  [Benzonatate] Rash    Current Outpatient Medications on File Prior to Visit  Medication Sig  . albuterol (VENTOLIN HFA) 108 (90 Base) MCG/ACT inhaler Inhale 2 puffs into the lungs every 4 (four) hours as needed for wheezing or shortness of breath.  . ALPRAZolam (XANAX XR) 0.5 MG 24 hr tablet TAKE 1 TABLET BY MOUTH EVERY DAY  . busPIRone (BUSPAR) 10 MG tablet TAKE 1/2-1 TAB 2-3 TIMES DAILY AS DIRECTED FOR ANXIETY.  . fluconazole (DIFLUCAN) 150 MG tablet Take one tablet by mouth at onset of yeast symptoms.  Take second tablet on day three.  . fluconazole (DIFLUCAN) 150 MG tablet Take one tablet by mouth at start of antibiotics.  Take second tablet on day 3 then as needed for yeast symptoms.  . nadolol (CORGARD) 80 MG tablet Start by taking 40 mg (1/2 tab) daily; titrate up to full tab in 2 weeks. For blood pressure and migraine prophylaxis.  [name] Estrad-Fe Biphas (LO LOESTRIN FE PO) Take by mouth.  . pantoprazole (PROTONIX) 40 MG tablet Take 1 tablet (40 mg total) by mouth daily.  . SUMAtriptan (IMITREX) 100 MG tablet Take 1 tablet (100 mg total) by mouth once as needed for migraine. May repeat in 2 hours if headache persists or recurs.  . traZODone (DESYREL) 50 MG tablet TAKE 1/2 TO 1 TABLET BY MOUTH FOR SLEEP  . venlafaxine XR (EFFEXOR-XR) 150 MG 24 hr capsule Take 1 capsule daily for Mood  . montelukast (SINGULAIR)  10 MG tablet Take 1 tablet (10 mg total) by mouth daily.   No current facility-administered medications on file prior to visit.     ROS: all negative except above.   Physical Exam:  Ht 5\' 8"  (1.727 m)   Wt 158 lb (71.7 kg)   BMI 24.02 kg/m   General Appearance: Well nourished, in no apparent distress. Eyes: conjunctiva no swelling or erythema ENT/Mouth: Hearing normal.  Neck: Supple Respiratory: Respiratory effort normal Cardio: Brisk peripheral pulses without edema.  Musculoskeletal: Full ROM, normal gait.  Skin: Warm, dry; L 1st digit toenail lateral cut  at an angle with edge not identifiable; does not appear to be curling down/under; lateral tissue is inflamed, with seropurulent discharge, no fluctuance, no appearence of abscess under toenail.  Neuro: Sensation intact.  Psych: Awake and oriented X 3, normal affect, Insight and Judgment appropriate.     Dan Maker, NP 3:07 PM [facility]

## 2018-12-28 ENCOUNTER — Other Ambulatory Visit: Payer: Self-pay | Admitting: Physician Assistant

## 2019-01-22 ENCOUNTER — Other Ambulatory Visit: Payer: Self-pay | Admitting: Adult Health

## 2019-01-24 IMAGING — MR MR ABDOMEN WO/W CM MRCP
14 of 20 series · 29 of 48 positions shown · IV contrast (13ml multihance)
Comparison: CT scan 09/21/2014

CLINICAL DATA: Eight month history of upper abdominal pain and
nausea. History of cholecystectomy in 2990.

Creatinine was obtained on site at [HOSPITAL] at [HOSPITAL].
Results: Creatinine 0.7 mg/dL.
EXAM:
MRI ABDOMEN WITHOUT AND WITH CONTRAST (INCLUDING MRCP)
TECHNIQUE: Multiplanar multisequence MR imaging of the abdomen was performed
both before and after the administration of intravenous contrast.
Heavily T2-weighted images of the biliary and pancreatic ducts were
obtained, and three-dimensional MRCP images were rendered by post
processing.
CONTRAST:  13mL MULTIHANCE GADOBENATE DIMEGLUMINE 529 MG/ML IV SOLN

[Series 3: cor haste · coronal · 5.0mm · 0.74mm/px · 2 of 32 slices shown]
[im 1/32]
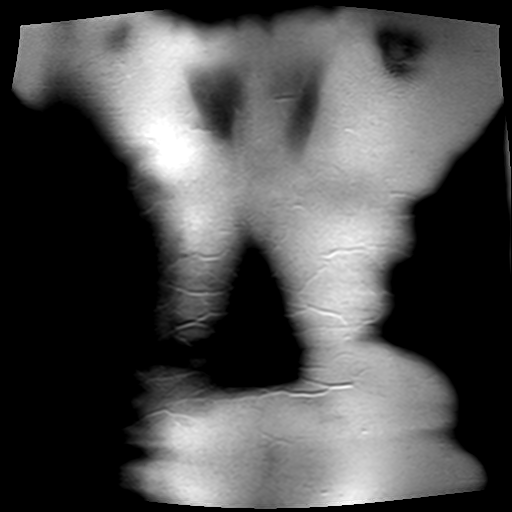
[im 32/32]
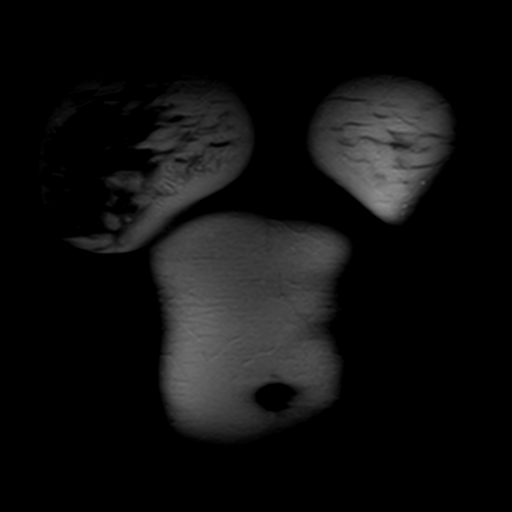

[Series 4: axial haste · axial · 6.0mm · 0.70mm/px · z∈[-56,+155]mm · 2 of 33 slices shown]
[im 1/33]
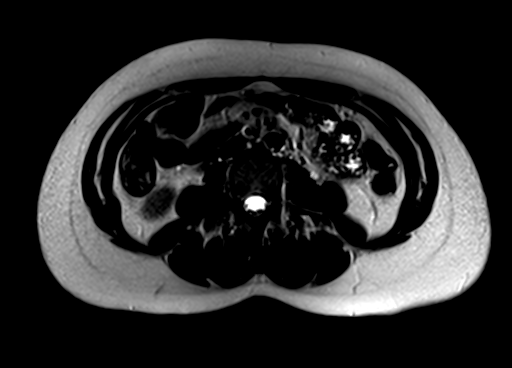
[im 33/33]
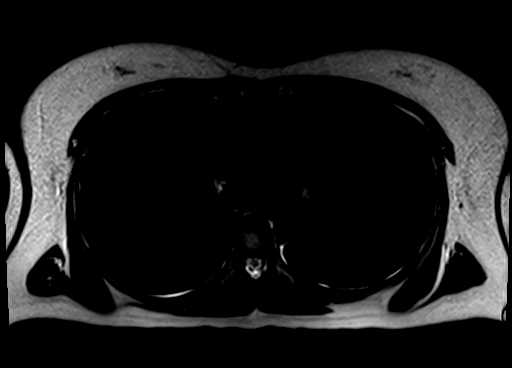

[Series 9: T2 · axial · 6.0mm · 1.06mm/px · 1 of 32 slices shown (1 of 2)]
[im 1/32]
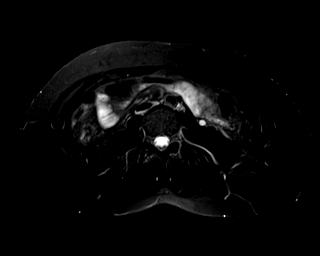

[Series 13: ep2d_diff_b50_500_[id number]_trig · axial · 6.0mm · 1.98mm/px · z∈[-50,+159]mm · 3 of 90 slices shown]
[im 1/90]
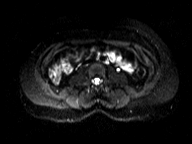
[im 45/90]
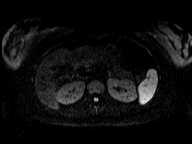
[im 90/90]
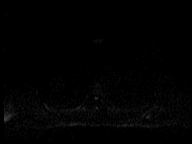

[Series 14: [id number]_trig_adc · axial · 6.0mm · 1.98mm/px · 1 of 30 slices shown]
[im 1/30]
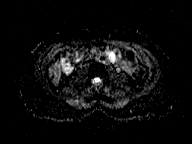

[Series 15: bSSFP · coronal · 5.0mm · 0.78mm/px · 1 of 27 slices shown (1 of 3)]
[im 1/27]
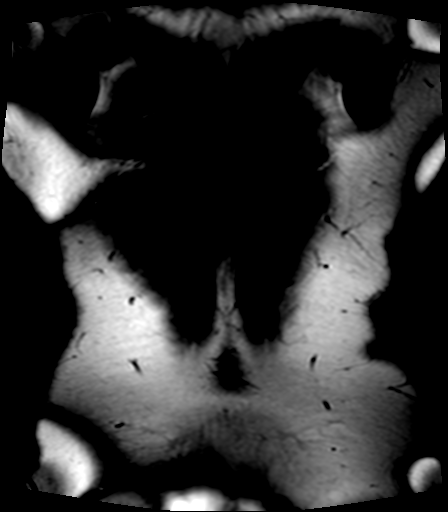

[Series 16: T2 · coronal · 3.0mm · 0.70mm/px · 2 of 49 slices shown (2 of 2)]
[im 1/49]
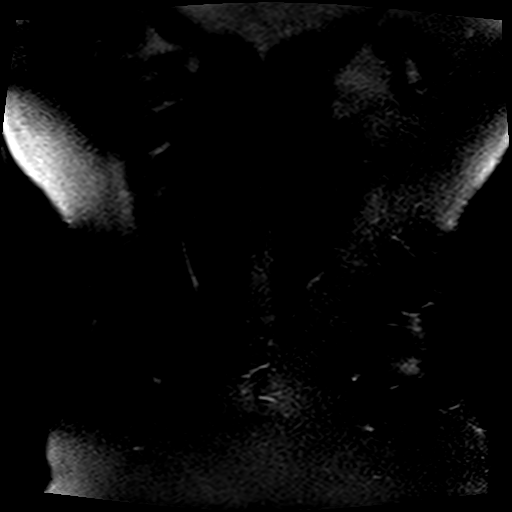
[im 49/49]
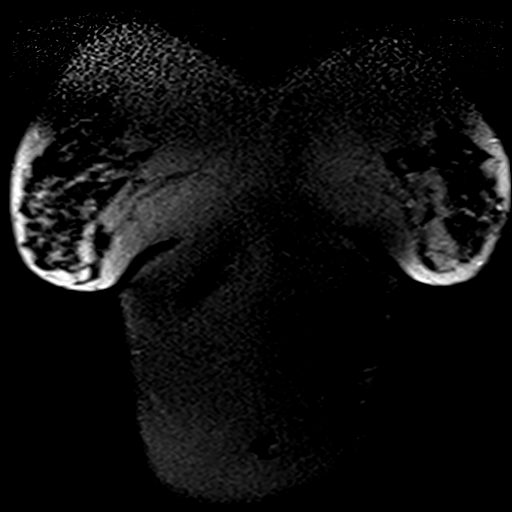

[Series 17: T1 · axial · 6.0mm · 0.74mm/px · z∈[-55,+150]mm · 2 of 64 slices shown]
[im 1/64]
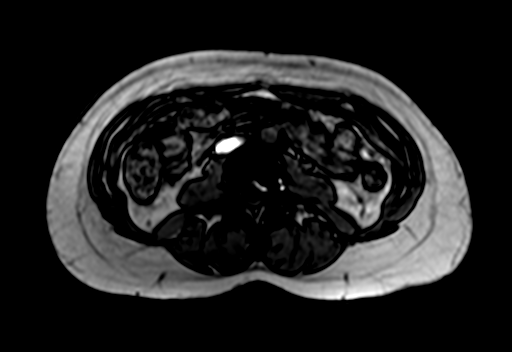
[im 64/64]
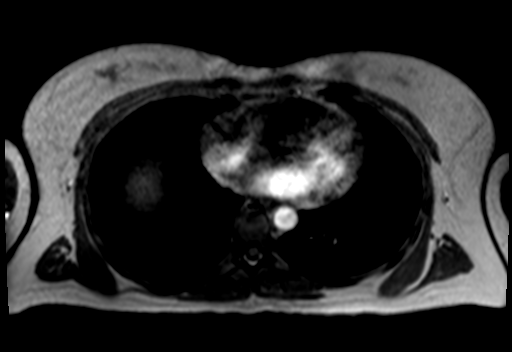

[Series 18: bSSFP · axial · 4.0mm · 0.74mm/px · z∈[-55,+149]mm · 2 of 52 slices shown (2 of 3)]
[im 1/52]
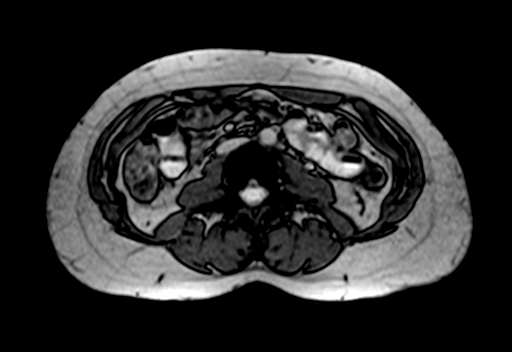
[im 52/52]
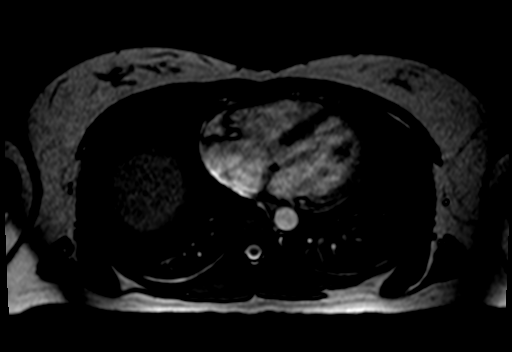

[Series 19: bSSFP · axial · 4.0mm · 0.74mm/px · z∈[-53,+163]mm · 2 of 55 slices shown (3 of 3)]
[im 1/55]
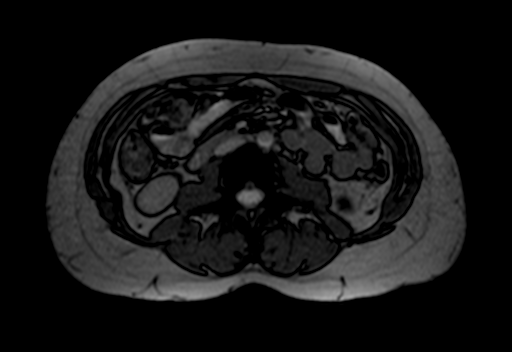
[im 55/55]
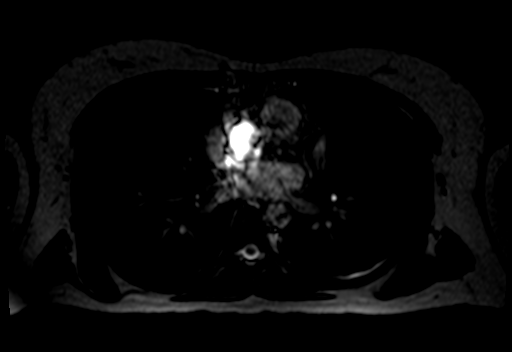

[Series 20: T1 dynamic · axial · non-contrast · 2.5mm · 0.74mm/px · z∈[-69,+168]mm · 3 of 96 slices shown]
[im 1/96]
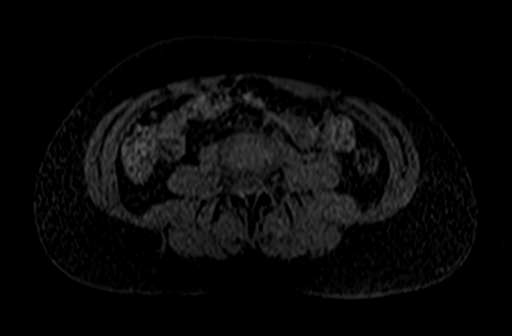
[im 48/96]
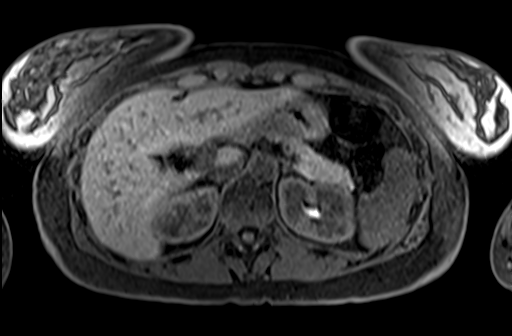
[im 96/96]
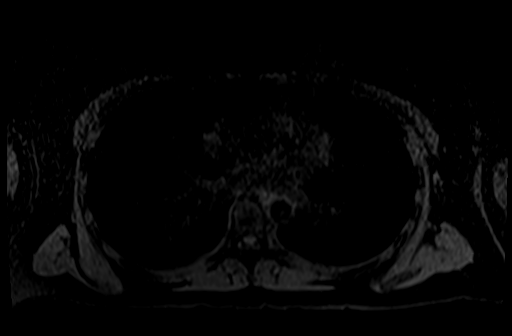

[Series 21: T1 dynamic post-contrast · axial · 2.5mm · 0.74mm/px · z∈[-69,+168]mm · 3 of 96 slices shown (1 of 3)]
[im 1/96]
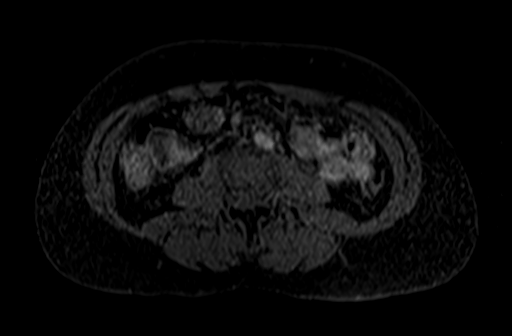
[im 48/96]
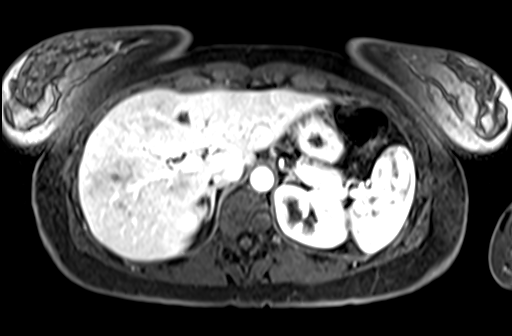
[im 96/96]
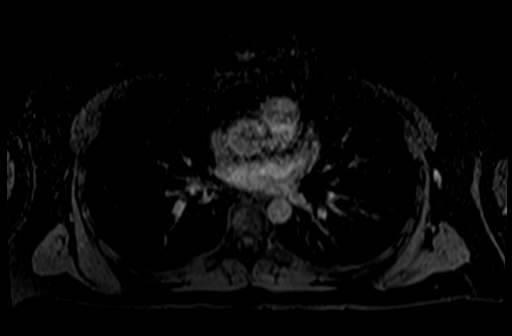

[Series 22: T1 dynamic post-contrast · axial · 2.5mm · 0.74mm/px · z∈[-69,+168]mm · 3 of 96 slices shown (2 of 3)]
[im 1/96]
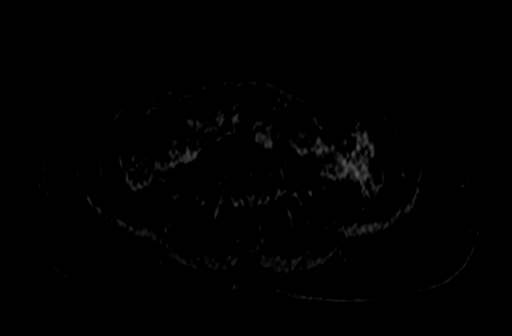
[im 48/96]
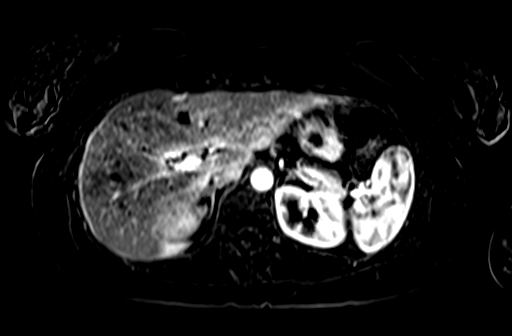
[im 96/96]
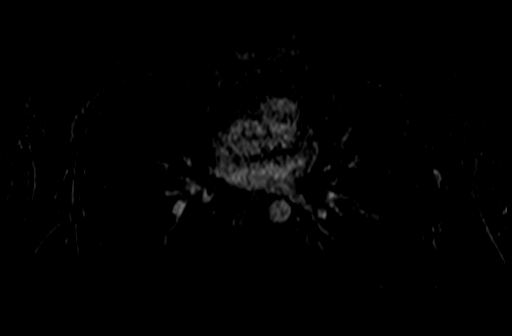

[Series 23: T1 dynamic post-contrast · axial · 2.5mm · 0.74mm/px · z∈[-69,+48]mm · 2 of 96 slices shown (3 of 3)]
[im 1/96]
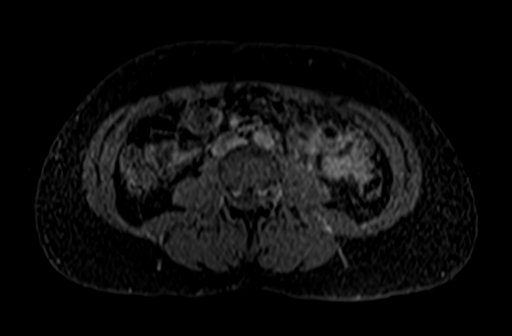
[im 48/96]
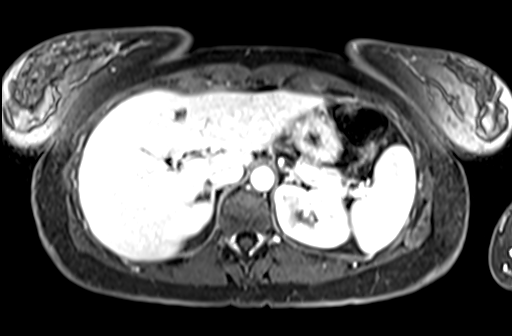

[29 of 48 positions shown; findings below may reference images not displayed]

FINDINGS: Lower chest: The lung bases are clear of an acute process. No
pleural or pericardial effusion.

Hepatobiliary: No focal hepatic lesions. Slight intrahepatic biliary
dilatation due to prior cholecystectomy. No common bile duct
dilatation.

Pancreas: No mass, inflammation or ductal dilatation. Suspect
pancreatic divisum.

Spleen:  Normal size.  No focal lesions.

Adrenals/Urinary Tract:  The adrenal glands and kidneys are normal.

Stomach/Bowel: Again demonstrated is a small diverticulum of the
gastric cardia. The duodenum, visualized small bowel and visualize
colon are unremarkable. No acute inflammatory changes, mass lesions
or obstructive findings.

Vascular/Lymphatic: The aorta and branch vessels are patent. The
major venous structures are patent. No mesenteric or retroperitoneal
mass or adenopathy.

Other:  No ascites or abdominal wall hernia.

Musculoskeletal: No significant bony findings.
IMPRESSION: 1. No acute abdominal findings, mass lesions or adenopathy.
2. Status post cholecystectomy. No biliary dilatation. No common
bile duct dilatation or common bile duct stones.
3. Suspect pancreatic divisum.
4. Stable small diverticulum of the gastric cardia.

## 2019-02-06 DIAGNOSIS — K219 Gastro-esophageal reflux disease without esophagitis: Secondary | ICD-10-CM | POA: Insufficient documentation

## 2019-02-06 NOTE — Progress Notes (Deleted)
Assessment and Plan:  Isabel Hubbard was seen today for headache.  Diagnoses and all orders for this visit:  Hypertension, unspecified type Continue medication: labetalol 200 mg BID Monitor blood pressure at home; call if consistently over 130/80 Continue DASH diet.   Reminder to go to the ER if any CP, SOB, nausea, dizziness, severe HA, changes vision/speech, left arm numbness and tingling and jaw pain.  Anxiety Improved with effexor 150 mg daily, buspar 10 mg TID, xanax PRN *** Stress management techniques discussed, increase water, good sleep hygiene discussed, increase exercise, and increase veggies.  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 Discussed med's effects and SE's.   Over 30 minutes of exam, counseling, chart review, and critical decision making was performed.   Future Appointments  Date Time Provider Department Center  02/08/2019  3:45 PM Judd Gaudier, NP [facility] None    ------------------------------------------------------------------------------------------------------------------  HPI 28 y.o.female presents for 6 month follow up of anxiety, insomnia, asthma and migraines; she has negative head CT from 01/28/2018 after she reports "worst headache ever."   She had multiple severe migraines last year that were typically preceded by poor night's rest, 2 hours etc.  Describes  headaches as always behind an eye, throbbing pain, photo and phonophobia with nausea.  She is currently on labetalol 200 mg BID for BP and prophylaxis, and has restarted on lo loestrin which controlled her migraines well prior to recent pregnancy (had daughter [name] in March 2019, she is not breastfeeding, delivered at 36 weeks due to HELLP syndrome).   She has imitrex 100 mg tab as abortive, but found this to be ineffective as abortive for recent migraine episodes, which she attributes to stress/lack of sleep.  She has been initiated on trazodone 25-50 mg at night for sleep.   She reports anxiety is improved on effexor XR 150 mg and buspar 10 mg TID ***; she has xanax to use PRN ***  Please be aware that some of the medications that you are on can sometimes cause a rare and potentially dangerous adverse reaction, called SEROTONIN SYNDROME: Symptoms of this condition include (but are not limited to):  Agitation or restlessness, confusion, rapid heart rate and high blood pressure, dilated pupils, loss of muscle coordination or twitching muscles, muscle rigidity/stiffness, sweating and/or flushing, diarrhea, headache, shivering, goose bumps. If you have any of these symptoms you may have to stop the medication. Call your health care provider immediately.   Severe serotonin syndrome can be life-threatening emergency. Signs and symptoms of a severe reaction may include: high fever, seizures, irregular heartbeat, unconsciousness or altered level of awareness or personality changes.  If you have any of these new symptoms, call 911 or have someone take you to the emergency room.    She has asthma currently managed on singulair, albuterol ***  she has a diagnosis of GERD which is currently managed by pantoprazole 40 mg daily *** she reports symptoms {ACTION;  ARE/ARE [id number]} currently well controlled, and denies breakthrough reflux, burning in chest, hoarseness or cough.      Past Medical  History:  Diagnosis Date  . Allergy   . Anxiety   . Asthma   . Depression   . Hypertension      Allergies  Allergen Reactions  . Dexamethasone Rash  . Sulfa Antibiotics Rash  . Naproxen Other (See Comments)    GERD/Reflux  . Betadine [Povidone Iodine] Rash  . Penicillins Rash  . Prednisone Rash  . Tessalon Perles [Benzonatate] Rash    Current Outpatient Medications on File Prior to Visit  Medication Sig  . albuterol (VENTOLIN HFA) 108 (90 Base) MCG/ACT inhaler Inhale 2 puffs into the lungs every 4 (four) hours as needed for wheezing or shortness of breath.  . ALPRAZolam (XANAX XR) 0.5 MG 24 hr tablet TAKE 1 TABLET BY MOUTH EVERY DAY  . busPIRone (BUSPAR) 10 MG tablet TAKE 1/2-1 TAB 2-3 TIMES DAILY AS DIRECTED FOR ANXIETY.  . cephALEXin (KEFLEX) 500 MG capsule Take 1 capsule (500 mg total) by mouth 3 (three) times daily.  . fluconazole (DIFLUCAN) 150 MG tablet Take one tablet by mouth at onset of yeast symptoms.  Take second tablet on day three.  . fluconazole (DIFLUCAN) 150 MG tablet Take one tablet by mouth at start of antibiotics.  Take second tablet on day 3 then as needed for yeast symptoms.  . montelukast (SINGULAIR) 10 MG tablet Take 1 tablet (10 mg total) by mouth daily.  . nadolol (CORGARD) 80 MG tablet Take 1 tablet Daily for BP & Headache prophylaxis  . Norethin-Eth Estrad-Fe Biphas (LO LOESTRIN FE PO) Take by mouth.  . pantoprazole (PROTONIX) 40 MG tablet TAKE 1 TABLET BY MOUTH EVERY DAY  . SUMAtriptan (IMITREX) 100 MG tablet Take 1 tablet (100 mg total) by mouth once as needed for migraine. May repeat in 2 hours if headache persists or recurs.  . traZODone (DESYREL) 50 MG tablet TAKE 1/2 TO 1 TABLET BY MOUTH FOR SLEEP  . venlafaxine XR (EFFEXOR-XR) 150 MG 24 hr capsule Take 1 capsule daily for Mood   No current facility-administered medications on file prior to visit.     ROS: all negative except above.   Physical Exam:  There were no vitals taken for this  visit.  General Appearance: Well nourished, in no apparent distress. Eyes: PERRLA, EOMs, conjunctiva no swelling or erythema Sinuses: No Frontal/maxillary tenderness ENT/Mouth: Ext aud canals clear, TMs without erythema, bulging. No erythema, swelling, or exudate on post pharynx.  Tonsils not swollen or erythematous. Hearing normal.  Neck: Supple, thyroid normal.  Respiratory: Respiratory effort normal, BS equal bilaterally without rales, rhonchi, wheezing or stridor.  Cardio: RRR with no MRGs. Brisk peripheral pulses without edema.  Abdomen: Soft, + BS.  Non tender, no guarding, rebound, hernias, masses. Lymphatics: Non tender without lymphadenopathy.  Musculoskeletal: Full ROM, 5/5 strength, normal gait.  Skin: Warm, dry without rashes, lesions, ecchymosis.  Neuro: Cranial nerves intact. Normal muscle tone, no cerebellar symptoms. Sensation intact.  Psych: Awake and oriented X 3, normal affect, Insight and Judgment appropriate.     Dan MakerAshley C Jannatul Wojdyla, NP 4:08 PM [facility]

## 2019-02-08 ENCOUNTER — Ambulatory Visit: Payer: Self-pay | Admitting: Adult Health

## 2019-02-15 ENCOUNTER — Other Ambulatory Visit: Payer: Self-pay | Admitting: Adult Health

## 2019-02-15 NOTE — Progress Notes (Signed)
Assessment and Plan:  Isabel Hubbard was seen today for follow up:   Hypertension, unspecified type Continue medication: nadolol 80 mg daily  Monitor blood pressure at home; call if consistently over 130/80 Continue DASH diet.   Reminder to go to the ER if any CP, SOB, nausea, dizziness, severe HA, changes vision/speech, left arm numbness and tingling and jaw pain.  Anxiety Continue buspar 10 mg TID, xanax PRN  Taper off of effexor to evaluate progress with night sweats - 37.5 mg tabs given to taper over several weeks with instructions Suggested after off of effexor trial of increased trazodone x 1-2 weeks Lexapro 10 mg discussed; script printed to hold and initiate if needed if increased trazodone not helpful Stress management techniques discussed, increase water, good sleep hygiene discussed, increase exercise, and increase veggies.  Reviewed serotonin syndrome sx and risks  Insomnia, unspecified type - good sleep hygiene discussed, increase day time activity Can use melatonin, benadryl to augment, use xanax sparingly if needed -     traZODone (DESYREL) 50 MG tablet; 1-3 tablet for sleep  Migraines Continue BB prophylaxis, HBC Last 2 have been triggered by insomnia/stress Will work on sleep Stress improved as going into summer   Asthma Well controlled with current agents; rarely needs inhaler  Further disposition pending results of labs. Discussed med's effects and SE's.   Over 30 minutes of exam, counseling, chart review, and critical decision making was performed.   Future Appointments  Date Time Provider Department Center  08/21/2019  3:45 PM Judd Gaudierorbett, Jezreel Justiniano, NP [facility] None    ------------------------------------------------------------------------------------------------------------------  HPI BP 122/80   Pulse (!) 57   Temp 97.7 F (36.5 C)   Wt 156 lb 6.4 oz (70.9 kg)   SpO2 99%   BMI 23.78 kg/m   28 y.o.female presents for 6 month follow up of anxiety,  insomnia, asthma and migraines; she has negative head CT from 01/28/2018 after she reports "worst headache ever."   She had multiple severe migraines last year that were typically preceded by poor night's rest. Describes headaches as always behind an eye, throbbing pain, photo and phonophobia with nausea.  She is currently on nadolol 80 mg daily for BP and prophylaxis, and on on lo loestrin which controlled her migraines well prior to recent pregnancy (had daughter Zollie Scaleolivia in March 2019, she is not breastfeeding, delivered at 36 weeks due to HELLP syndrome).   She has imitrex 100 mg tab as abortive. She has been initiated on trazodone 50 mg at night for sleep which is somewhat helpful, often augments with benadryl.   She has been on effexor XR 150 mg daily for anxiety and migraines, buspar 10 mg TID. She has xanax to use PRN but does so very rarely.   She reports anxiety is improved on effexor XR 150 mg and buspar 10 mg TID; she has xanax to use PRN rarely However, was seen by GYN with negative extensive lab workup then later rheumatology due to unexplained night sweats and was recommended to stop effexor as possible cause of her sx. She has been on zoloft 150 mg in the past for anxiety which was helpful but caused sedation.   She has asthma currently managed on singulair, rarely needs albuterol.   she has a diagnosis of GERD which is currently managed by pantoprazole 40 mg daily (has been on for 2-3 years since GI initiated). Has been on ranitidine, famotidine, tagamet in the past.  she reports symptoms is currently well controlled, and denies breakthrough reflux,  burning in chest, hoarseness or cough.      Past Medical History:  Diagnosis Date  . Allergy   . Anxiety   . Asthma   . Depression   . Hypertension      Allergies  Allergen Reactions  . Dexamethasone Rash  . Sulfa Antibiotics Rash  . Naproxen Other (See Comments)    GERD/Reflux  . Betadine [Povidone Iodine] Rash  .  Penicillins Rash  . Prednisone Rash  . Tessalon Perles [Benzonatate] Rash    Current Outpatient Medications on File Prior to Visit  Medication Sig  . albuterol (VENTOLIN HFA) 108 (90 Base) MCG/ACT inhaler Inhale 2 puffs into the lungs every 4 (four) hours as needed for wheezing or shortness of breath.  . busPIRone (BUSPAR) 10 MG tablet TAKE 1/2-1 TAB 2-3 TIMES DAILY AS DIRECTED FOR ANXIETY.  . nadolol (CORGARD) 80 MG tablet Take 1 tablet Daily for BP & Headache prophylaxis  . Norethin-Eth Estrad-Fe Biphas (LO LOESTRIN FE PO) Take by mouth.  . pantoprazole (PROTONIX) 40 MG tablet TAKE 1 TABLET BY MOUTH EVERY DAY  . SUMAtriptan (IMITREX) 100 MG tablet Take 1 tablet (100 mg total) by mouth once as needed for migraine. May repeat in 2 hours if headache persists or recurs.  . traZODone (DESYREL) 50 MG tablet Take 1/2 to 1 tablet 1 hour before Sleep  . montelukast (SINGULAIR) 10 MG tablet Take 1 tablet (10 mg total) by mouth daily.   No current facility-administered medications on file prior to visit.     ROS: Review of Systems  Constitutional: Negative for malaise/fatigue and weight loss.  HENT: Negative for hearing loss and tinnitus.   Eyes: Negative for blurred vision and double vision.  Respiratory: Negative for cough, shortness of breath and wheezing.   Cardiovascular: Negative for chest pain, palpitations, orthopnea, claudication and leg swelling.  Gastrointestinal: Negative for abdominal pain, blood in stool, constipation, diarrhea, heartburn, melena, nausea and vomiting.  Genitourinary: Negative.   Musculoskeletal: Negative for joint pain and myalgias.  Skin: Negative for rash.  Neurological: Negative for dizziness, tingling, sensory change, weakness and headaches.  Endo/Heme/Allergies: Negative for polydipsia.       Night sweats  Psychiatric/Behavioral: Negative for depression, memory loss, substance abuse and suicidal ideas. The patient is nervous/anxious and has insomnia.   All  other systems reviewed and are negative.    Physical Exam:  BP 122/80   Pulse (!) 57   Temp 97.7 F (36.5 C)   Wt 156 lb 6.4 oz (70.9 kg)   SpO2 99%   BMI 23.78 kg/m   General Appearance: Well nourished, in no apparent distress. Eyes: PERRLA, EOMs, conjunctiva no swelling or erythema Sinuses: No Frontal/maxillary tenderness ENT/Mouth: Ext aud canals clear, TMs without erythema, bulging. No erythema, swelling, or exudate on post pharynx.  Tonsils not swollen or erythematous. Hearing normal.  Neck: Supple, thyroid normal.  Respiratory: Respiratory effort normal, BS equal bilaterally without rales, rhonchi, wheezing or stridor.  Cardio: RRR with no MRGs. Brisk peripheral pulses without edema.  Abdomen: Soft, + BS.  Non tender, no guarding, rebound, hernias, masses. Lymphatics: Non tender without lymphadenopathy.  Musculoskeletal: Full ROM, 5/5 strength, normal gait.  Skin: Warm, dry without rashes, lesions, ecchymosis.  Neuro: Cranial nerves intact. Normal muscle tone, no cerebellar symptoms. Sensation intact.  Psych: Awake and oriented X 3, normal affect, Insight and Judgment appropriate.     Dan Maker, NP 4:17 PM [facility]

## 2019-02-16 ENCOUNTER — Encounter: Payer: Self-pay | Admitting: Adult Health

## 2019-02-16 ENCOUNTER — Other Ambulatory Visit: Payer: Self-pay

## 2019-02-16 ENCOUNTER — Ambulatory Visit: Payer: BC Managed Care – PPO | Admitting: Adult Health

## 2019-02-16 VITALS — BP 122/80 | HR 57 | Temp 97.7°F | Wt 156.4 lb

## 2019-02-16 DIAGNOSIS — K219 Gastro-esophageal reflux disease without esophagitis: Secondary | ICD-10-CM

## 2019-02-16 DIAGNOSIS — I10 Essential (primary) hypertension: Secondary | ICD-10-CM | POA: Diagnosis not present

## 2019-02-16 DIAGNOSIS — F419 Anxiety disorder, unspecified: Secondary | ICD-10-CM

## 2019-02-16 DIAGNOSIS — G43919 Migraine, unspecified, intractable, without status migrainosus: Secondary | ICD-10-CM

## 2019-02-16 DIAGNOSIS — G4700 Insomnia, unspecified: Secondary | ICD-10-CM

## 2019-02-16 DIAGNOSIS — J4520 Mild intermittent asthma, uncomplicated: Secondary | ICD-10-CM | POA: Diagnosis not present

## 2019-02-16 DIAGNOSIS — R61 Generalized hyperhidrosis: Secondary | ICD-10-CM

## 2019-02-16 MED ORDER — VENLAFAXINE HCL ER 37.5 MG PO CP24: ORAL_CAPSULE | ORAL | 0 refills | Status: DC

## 2019-02-16 MED ORDER — ESCITALOPRAM OXALATE 10 MG PO TABS: 10.0000 mg | ORAL_TABLET | Freq: Every day | ORAL | 2 refills | Status: DC

## 2019-02-16 MED ORDER — ALPRAZOLAM ER 0.5 MG PO TB24: 0.5000 mg | ORAL_TABLET | Freq: Every day | ORAL | 0 refills | Status: DC | PRN

## 2019-02-16 NOTE — Patient Instructions (Addendum)
Slow taper off of effexor  Then try increasing trazodone to 100 mg nightly, can go up to 150 mg   If not beneficial or anxiety is bad, or still having lots of night sweats -   Cut back   Then start lexapro 10 mg daily    Escitalopram tablets What is this medicine? ESCITALOPRAM (es sye TAL oh pram) is used to treat depression and certain types of anxiety. This medicine may be used for other purposes; ask your health care provider or pharmacist if you have questions. COMMON BRAND NAME(S): Lexapro What should I tell my health care provider before I take this medicine? They need to know if you have any of these conditions: -bipolar disorder or a family history of bipolar disorder -diabetes -glaucoma -heart disease -kidney or liver disease -receiving electroconvulsive therapy -seizures (convulsions) -suicidal thoughts, plans, or attempt by you or a family member -an unusual or allergic reaction to escitalopram, the related drug citalopram, other medicines, foods, dyes, or preservatives -pregnant or trying to become pregnant -breast-feeding How should I use this medicine? Take this medicine by mouth with a glass of water. Follow the directions on the prescription label. You can take it with or without food. If it upsets your stomach, take it with food. Take your medicine at regular intervals. Do not take it more often than directed. Do not stop taking this medicine suddenly except upon the advice of your doctor. Stopping this medicine too quickly may cause serious side effects or your condition may worsen. A special MedGuide will be given to you by the pharmacist with each prescription and refill. Be sure to read this information carefully each time. Talk to your pediatrician regarding the use of this medicine in children. Special care may be needed. Overdosage: If you think you have taken too much of this medicine contact a poison control center or emergency room at once. NOTE: This  medicine is only for you. Do not share this medicine with others. What if I miss a dose? If you miss a dose, take it as soon as you can. If it is almost time for your next dose, take only that dose. Do not take double or extra doses. What may interact with this medicine? Do not take this medicine with any of the following medications: -certain medicines for fungal infections like fluconazole, itraconazole, ketoconazole, posaconazole, voriconazole -cisapride -citalopram -dofetilide -dronedarone -linezolid -MAOIs like Carbex, Eldepryl, Marplan, Nardil, and Parnate -methylene blue (injected into a vein) -pimozide -thioridazine -ziprasidone This medicine may also interact with the following medications: -alcohol -amphetamines -aspirin and aspirin-like medicines -carbamazepine -certain medicines for depression, anxiety, or psychotic disturbances -certain medicines for migraine headache like almotriptan, eletriptan, frovatriptan, naratriptan, rizatriptan, sumatriptan, zolmitriptan -certain medicines for sleep -certain medicines that treat or prevent blood clots like warfarin, enoxaparin, dalteparin -cimetidine -diuretics -fentanyl -furazolidone -isoniazid -lithium -metoprolol -NSAIDs, medicines for pain and inflammation, like ibuprofen or naproxen -other medicines that prolong the QT interval (cause an abnormal heart rhythm) -procarbazine -rasagiline -supplements like St. John's wort, kava kava, valerian -tramadol -tryptophan This list may not describe all possible interactions. Give your health care provider a list of all the medicines, herbs, non-prescription drugs, or dietary supplements you use. Also tell them if you smoke, drink alcohol, or use illegal drugs. Some items may interact with your medicine. What should I watch for while using this medicine? Tell your doctor if your symptoms do not get better or if they get worse. Visit your doctor or health care professional for  regular checks on your progress. Because it may take several weeks to see the full effects of this medicine, it is important to continue your treatment as prescribed by your doctor. Patients and their families should watch out for new or worsening thoughts of suicide or depression. Also watch out for sudden changes in feelings such as feeling anxious, agitated, panicky, irritable, hostile, aggressive, impulsive, severely restless, overly excited and hyperactive, or not being able to sleep. If this happens, especially at the beginning of treatment or after a change in dose, call your health care professional. Bonita Quin may get drowsy or dizzy. Do not drive, use machinery, or do anything that needs mental alertness until you know how this medicine affects you. Do not stand or sit up quickly, especially if you are an older patient. This reduces the risk of dizzy or fainting spells. Alcohol may interfere with the effect of this medicine. Avoid alcoholic drinks. Your mouth may get dry. Chewing sugarless gum or sucking hard candy, and drinking plenty of water may help. Contact your doctor if the problem does not go away or is severe. What side effects may I notice from receiving this medicine? Side effects that you should report to your doctor or health care professional as soon as possible: -allergic reactions like skin rash, itching or hives, swelling of the face, lips, or tongue -anxious -black, tarry stools -changes in vision -confusion -elevated mood, decreased need for sleep, racing thoughts, impulsive behavior -eye pain -fast, irregular heartbeat -feeling faint or lightheaded, falls -feeling agitated, angry, or irritable -hallucination, loss of contact with reality -loss of balance or coordination -loss of memory -painful or prolonged erections -restlessness, pacing, inability to keep still -seizures -stiff muscles -suicidal thoughts or other mood changes -trouble sleeping -unusual bleeding or  bruising -unusually weak or tired -vomiting Side effects that usually do not require medical attention (report to your doctor or health care professional if they continue or are bothersome): -changes in appetite -change in sex drive or performance -headache -increased sweating -indigestion, nausea -tremors This list may not describe all possible side effects. Call your doctor for medical advice about side effects. You may report side effects to FDA at 1-800-FDA-1088. Where should I keep my medicine? Keep out of reach of children. Store at room temperature between 15 and 30 degrees C (59 and 86 degrees F). Throw away any unused medicine after the expiration date. NOTE: This sheet is a summary. It may not cover all possible information. If you have questions about this medicine, talk to your doctor, pharmacist, or health care provider.  2019 Elsevier/Gold Standard (2016-02-24 13:20:23)

## 2019-03-12 ENCOUNTER — Other Ambulatory Visit: Payer: Self-pay | Admitting: Adult Health

## 2019-04-12 ENCOUNTER — Other Ambulatory Visit: Payer: Self-pay | Admitting: Adult Health

## 2019-04-12 MED ORDER — VENLAFAXINE HCL ER 150 MG PO CP24: 150.0000 mg | ORAL_CAPSULE | Freq: Every day | ORAL | 1 refills | Status: DC

## 2019-04-12 MED ORDER — TRAZODONE HCL 100 MG PO TABS: ORAL_TABLET | ORAL | 1 refills | Status: DC

## 2019-04-12 MED ORDER — PANTOPRAZOLE SODIUM 20 MG PO TBEC: DELAYED_RELEASE_TABLET | ORAL | 1 refills | Status: DC

## 2019-04-13 ENCOUNTER — Other Ambulatory Visit: Payer: Self-pay | Admitting: Adult Health

## 2019-04-20 ENCOUNTER — Other Ambulatory Visit: Payer: Self-pay | Admitting: Internal Medicine

## 2019-04-25 ENCOUNTER — Other Ambulatory Visit: Payer: Self-pay | Admitting: Adult Health

## 2019-04-25 MED ORDER — ALPRAZOLAM ER 0.5 MG PO TB24: 0.5000 mg | ORAL_TABLET | Freq: Every day | ORAL | 0 refills | Status: DC | PRN

## 2019-04-25 MED ORDER — MONTELUKAST SODIUM 10 MG PO TABS: 10.0000 mg | ORAL_TABLET | Freq: Every day | ORAL | 1 refills | Status: DC

## 2019-07-03 ENCOUNTER — Encounter: Payer: Self-pay | Admitting: Adult Health

## 2019-07-03 ENCOUNTER — Ambulatory Visit: Payer: BC Managed Care – PPO | Admitting: Adult Health

## 2019-07-03 ENCOUNTER — Other Ambulatory Visit: Payer: Self-pay

## 2019-07-03 VITALS — Temp 98.7°F | Wt 162.0 lb

## 2019-07-03 DIAGNOSIS — F418 Other specified anxiety disorders: Secondary | ICD-10-CM | POA: Diagnosis not present

## 2019-07-03 MED ORDER — HYDROXYZINE HCL 25 MG PO TABS: 25.0000 mg | ORAL_TABLET | Freq: Three times a day (TID) | ORAL | 0 refills | Status: DC | PRN

## 2019-07-03 NOTE — Progress Notes (Signed)
Virtual Visit via Telephone Note  I connected with Isabel Hubbard on 07/03/19 at 10:00 AM EDT by telephone and verified that I am speaking with the correct person using two identifiers.  Location: Patient: school office at work Provider: Marybelle Killings office    I discussed the limitations, risks, security and privacy concerns of performing an evaluation and management service by telephone and the availability of in person appointments. I also discussed with the patient that there may be a patient responsible charge related to this service. The patient expressed understanding and agreed to proceed.   History of Present Illness:  Temp 98.7 F (37.1 C)   Wt 162 lb (73.5 kg)   BMI 24.63 kg/m   28 y.o. female with history of migraines, anxiety and insomnia requests evaluation due to severe anxiety r/t work/pandemic.   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   She has been on effexor XR 150 mg daily, primarily for migraine prophylaxis (significantly reduced, only 10-12 in last 2 years since starting), buspar 10 mg TID; she is also on trazodone 100 mg daily for sleep which she feels has been very helpful. She is also prescribed xanax XR 0.5 mg tabs which she tries to limit use and takes 2-3 days per week.   She is newly following with a psychologist via telehealth, Rosilyn Mings at [facility], seeing every other week and feels this has been helpful. Did try to see psychiatrist earlier this year by our recommendation (had depression which she attributes to bad relationships) but was told a several month waiting list and never saw.   She was encouraged to follow up with [place] by her counselor due to some episodes of severe anxiety which have lead to outbreak of hives.   We discussed trial of  alternative agent to effexor at one point, lexapro was suggested but she never tried due to excellent migraine control with current agent; She was previously on zoloft for several years but "felt like a zombie", very low libido, never felt like herself, would prefer to avoid.   She reports since being told about transition at work has had only "bad" and "worse" days during the week. She reports xanax does work very well, can function at work on this, but knows she needs to limit use due to previously taking daily for several months and consequently going through withdrawals. She reports she will be working 4 days a week. She reports on days not working, anxiety is fairly controlled and doesn't need xanax on the weekends.   Allergies:  Allergies  Allergen Reactions  . Dexamethasone Rash  . Sulfa Antibiotics Rash  . Naproxen Other (See Comments)    GERD/Reflux  . Betadine [Povidone Iodine] Rash  . Penicillins Rash  . Prednisone Rash  . Tessalon Perles [Benzonatate] Rash   Medical History:  has Asthma; Migraines; Hypertension; Anxiety; Insomnia; Unexplained night sweats; and Acid reflux on their problem list. Surgical History:  She  has a past surgical history that includes Appendectomy; Cholecystectomy (12/27/14); and Wisdom tooth extraction. Family History:  Herfamily history includes COPD in her paternal grandfather; Cancer in her paternal grandmother; Cancer (age of onset: 14) in her mother; Cancer (age of onset: 6) in her maternal grandmother; Hyperlipidemia in her father, paternal grandfather, and paternal grandmother; Stroke in her maternal grandfather. Social  History:   reports that she has never smoked. She has never used smokeless tobacco. She reports that she does not drink alcohol or use drugs.    Observations/Objective:  General : Well sounding patient in no apparent distress HEENT: no hoarseness, no cough for duration of visit Lungs: speaks in complete sentences, no audible  wheezing, no apparent distress Neurological: alert, oriented x 3 Psychiatric: pleasant, not tearful, no pressured speech, judgement appropriate   Assessment and Plan:  Satin was seen today for anxiety.  Diagnoses and all orders for this visit:  Situational anxiety Benefit with current daily agents; effexor XR 150 mg daily, buspar 10 mg TID, trazodone 100 mg nightly; well controlled on weekends; also improved with counseling Historically had SE with SSRIs Reviewed her situational anxiety needs; will be working 4 days a week and feels needs improved control with these days; xanax XR does work well for her, functions well with this medications; advised I feel comfortable with her utilizing this agent 4 days a week for short term situational anxiety with plan to decrease use in a few months  Have discussed my concern with risk for serotonin syndrome with current daily agents; do not recommend adding any medications that may contribute to this; she plans to reach out to psych via her counseling for OV for med review for their input/expertise which is appreciated Will also trial addition of vistaril PRN for anxiety associated with hives; she may trial this as an alternative to xanax to evaluate benefit Follow up in 6-8 weeks as scheduled or sooner if needed -     hydrOXYzine (ATARAX/VISTARIL) 25 MG tablet; Take 1-2 tablets (25-50 mg total) by mouth 3 (three) times daily as needed for anxiety (or hives).   Follow Up Instructions:    I discussed the assessment and treatment plan with the patient. The patient was provided an opportunity to ask questions and all were answered. The patient agreed with the plan and demonstrated an understanding of the instructions.   The patient was advised to call back or seek an in-person evaluation if the symptoms worsen or if the condition fails to improve as anticipated.  I provided 25 minutes of non-face-to-face time during this encounter.   Dan Maker,  NP

## 2019-07-09 ENCOUNTER — Other Ambulatory Visit: Payer: Self-pay | Admitting: Adult Health

## 2019-07-29 ENCOUNTER — Encounter (INDEPENDENT_AMBULATORY_CARE_PROVIDER_SITE_OTHER): Payer: Self-pay

## 2019-08-03 ENCOUNTER — Other Ambulatory Visit: Payer: Self-pay | Admitting: Adult Health

## 2019-08-03 MED ORDER — NADOLOL 80 MG PO TABS: ORAL_TABLET | ORAL | 0 refills | Status: DC

## 2019-08-18 DIAGNOSIS — E785 Hyperlipidemia, unspecified: Secondary | ICD-10-CM | POA: Insufficient documentation

## 2019-08-18 NOTE — Progress Notes (Signed)
Assessment and Plan:  Isabel Hubbard was seen today for follow up:   Hypertension, unspecified type Continue medication: nadolol 80 mg daily  Monitor blood pressure at home; call if consistently over 130/80 Continue DASH diet.   Reminder to go to the ER if any CP, SOB, nausea, dizziness, severe HA, changes vision/speech, left arm numbness and tingling and jaw pain.  Anxiety  Continue meds as recommended by psych; starting gabapentin, tapering effexor due to night sweats xanax PRN, vistaril PRN is working well for her Following with counseling Stress management techniques discussed, increase water, good sleep hygiene discussed, increase exercise, and increase veggies.  Reviewed serotonin syndrome sx and risks  Insomnia, unspecified type - good sleep hygiene discussed, increase day time activity Can use melatonin, benadryl to augment, use xanax sparingly if needed -     traZODone (DESYREL) 50 MG tablet; 1-3 tablet for sleep  Migraines Continue BB prophylaxis, HBC Typically triggered by insomnia/stress, sleep hygiene reviewed, trazodone is working well   Asthma Well controlled with current agents; rarely needs inhaler  Hyperlipidemia Currently managing by lifestyle;  Continue low cholesterol diet and exercise.  Check lipid panel.    Orders Placed This Encounter  Procedures  . CBC with Diff  . COMPLETE METABOLIC PANEL WITH GFR  . TSH  . Urinalysis, Routine w reflex microscopic  . Lipid Profile    Further disposition pending results of labs. Discussed med's effects and SE's.   Over 30 minutes of exam, counseling, chart review, and critical decision making was performed.   Future Appointments  Date Time Provider [place]  02/21/2020  3:45 PM Liane Comber, NP [facility] None    ------------------------------------------------------------------------------------------------------------------  HPI BP 132/80   Pulse 66   Temp 97.7 F (36.5 C)   Wt 164 lb (74.4 kg)    SpO2 98%   BMI 24.94 kg/m   28 y.o.female presents for 6 month follow up of anxiety, insomnia, asthma and migraines; she has negative head CT from 01/28/2018 after she reports "worst headache ever." She follows annually with GYN but unsure what labs have been done recently and requests labs be done today.   She had multiple severe migraines last year that were typically preceded by poor night's rest. Describes headaches as always behind an eye, throbbing pain, photo and phonophobia with nausea.  She is currently on nadolol 80 mg daily for BP and prophylaxis, and on on lo loestrin which controlled her migraines well prior to recent pregnancy (had daughter Minette Brine in March 2019, she is not breastfeeding, delivered at 36 weeks due to HELLP syndrome). She is also on effexor for migraine prophylaxis and has not had many migraines this year, last in Sept 2020 triggered by poor sleep.   She has imitrex 100 mg tab as abortive.  She is newly following with a psychologist Dr. Rosezella Rumpf per my recommendation due to poor control despite numerous agents.  She also follows with Rosilyn Mings at [facility], seeing every other week and feels this has been helpful. She reports has been advised to taper off of effexor due to suspicion this is causing night sweats (had unremarkable workup by GYN and endocrinology). She has stopped taking buspar.   She is now taking gabapentin 300 mg AM, 600 mg AM, tapering effexor, and uses xanax PRN (occasional, 2 days in last month) and vistaril 25 mg PRN. Anxiety was up with talks of opening back up schools but better since this has been postponed.  She has been on trazodone  100 mg at night for sleep which is somewhat helpful, often augments with benadryl.   She has asthma currently managed on singulair, rarely needs albuterol.   she has a diagnosis of GERD which is currently managed by pantoprazole 40 mg daily (has been on for 2-3 years since GI  initiated). Has been on ranitidine, famotidine, tagamet in the past.  she reports symptoms is currently well controlled, and denies breakthrough reflux, burning in chest, hoarseness or cough.    She has hx of labile BPs, on nadolol 80 mg for BP and migraine, today their BP is BP: 132/80  She does not workout. She denies chest pain, shortness of breath, dizziness.   She is not on cholesterol medication and denies myalgias. Her cholesterol is not at goal. The cholesterol last visit was:   Lab Results  Component Value Date   CHOL 195 09/01/2016   HDL 63 09/01/2016   LDLCALC 104 (H) 09/01/2016   TRIG 139 09/01/2016   CHOLHDL 3.1 09/01/2016     Past Medical History:  Diagnosis Date  . Allergy   . Anxiety   . Asthma   . Depression   . Hypertension      Allergies  Allergen Reactions  . Dexamethasone Rash  . Sulfa Antibiotics Rash  . Naproxen Other (See Comments)    GERD/Reflux  . Betadine [Povidone Iodine] Rash  . Penicillins Rash  . Prednisone Rash  . Tessalon Perles [Benzonatate] Rash    Current Outpatient Medications on File Prior to Visit  Medication Sig  . albuterol (VENTOLIN HFA) 108 (90 Base) MCG/ACT inhaler Inhale 2 puffs into the lungs every 4 (four) hours as needed for wheezing or shortness of breath.  . ALPRAZolam (XANAX XR) 0.5 MG 24 hr tablet TAKE 1 TABLET BY MOUTH EVERY DAY AS NEEDED FOR ANXIETY  . gabapentin (NEURONTIN) 300 MG capsule Take 300 mg by mouth. Take 300mg  in the morning and 600mg  in the evening  . hydrOXYzine (ATARAX/VISTARIL) 25 MG tablet Take 1-2 tablets (25-50 mg total) by mouth 3 (three) times daily as needed for anxiety (or hives).  . montelukast (SINGULAIR) 10 MG tablet Take 1 tablet (10 mg total) by mouth daily.  . nadolol (CORGARD) 80 MG tablet TAKE 1 TABLET DAILY FOR BP & HEADACHE PROPHYLAXIS  . Norethin-Eth Estrad-Fe Biphas (LO LOESTRIN FE PO) Take by mouth.  . pantoprazole (PROTONIX) 20 MG tablet Take 1 tab up to twice daily as needed for  reflux.  . SUMAtriptan (IMITREX) 100 MG tablet Take 1 tablet (100 mg total) by mouth once as needed for migraine. May repeat in 2 hours if headache persists or recurs.  . traZODone (DESYREL) 100 MG tablet Take 1/2 to 1 tablet 1 hour before Sleep  . venlafaxine XR (EFFEXOR-XR) 150 MG 24 hr capsule Take 1 capsule (150 mg total) by mouth daily with breakfast.  . busPIRone (BUSPAR) 10 MG tablet TAKE 1/2-1 TAB 2-3 TIMES DAILY AS DIRECTED FOR ANXIETY. (Patient not taking: Reported on 08/21/2019)   No current facility-administered medications on file prior to visit.     ROS: Review of Systems  Constitutional: Negative for malaise/fatigue and weight loss.  HENT: Negative for hearing loss and tinnitus.   Eyes: Negative for blurred vision and double vision.  Respiratory: Negative for cough, shortness of breath and wheezing.   Cardiovascular: Negative for chest pain, palpitations, orthopnea, claudication and leg swelling.  Gastrointestinal: Negative for abdominal pain, blood in stool, constipation, diarrhea, heartburn, melena, nausea and vomiting.  Genitourinary: Negative.  Musculoskeletal: Negative for joint pain and myalgias.  Skin: Negative for rash.  Neurological: Negative for dizziness, tingling, sensory change, weakness and headaches.  Endo/Heme/Allergies: Negative for polydipsia.       Night sweats  Psychiatric/Behavioral: Negative for depression, memory loss, substance abuse and suicidal ideas. The patient is nervous/anxious and has insomnia.   All other systems reviewed and are negative.    Physical Exam:  BP 132/80   Pulse 66   Temp 97.7 F (36.5 C)   Wt 164 lb (74.4 kg)   SpO2 98%   BMI 24.94 kg/m   General Appearance: Well nourished, in no apparent distress. Eyes: PERRLA, EOMs, conjunctiva no swelling or erythema Sinuses: No Frontal/maxillary tenderness ENT/Mouth: Ext aud canals clear, TMs without erythema, bulging. No erythema, swelling, or exudate on post pharynx.   Tonsils not swollen or erythematous. Hearing normal.  Neck: Supple, thyroid normal.  Respiratory: Respiratory effort normal, BS equal bilaterally without rales, rhonchi, wheezing or stridor.  Cardio: RRR with no MRGs. Brisk peripheral pulses without edema.  Abdomen: Soft, + BS.  Non tender, no guarding, rebound, hernias, masses. Lymphatics: Non tender without lymphadenopathy.  Musculoskeletal: Full ROM, 5/5 strength, normal gait.  Skin: Warm, dry without rashes, lesions, ecchymosis.  Neuro: Cranial nerves intact. Normal muscle tone, no cerebellar symptoms. Sensation intact.  Psych: Awake and oriented X 3, normal affect, Insight and Judgment appropriate.     Dan Maker, NP 5:46 PM [facility]

## 2019-08-21 ENCOUNTER — Ambulatory Visit (INDEPENDENT_AMBULATORY_CARE_PROVIDER_SITE_OTHER): Payer: BC Managed Care – PPO | Admitting: Adult Health

## 2019-08-21 ENCOUNTER — Encounter: Payer: Self-pay | Admitting: Adult Health

## 2019-08-21 ENCOUNTER — Other Ambulatory Visit: Payer: Self-pay

## 2019-08-21 VITALS — BP 132/80 | HR 66 | Temp 97.7°F | Wt 164.0 lb

## 2019-08-21 DIAGNOSIS — I10 Essential (primary) hypertension: Secondary | ICD-10-CM

## 2019-08-21 DIAGNOSIS — G43919 Migraine, unspecified, intractable, without status migrainosus: Secondary | ICD-10-CM | POA: Diagnosis not present

## 2019-08-21 DIAGNOSIS — G4700 Insomnia, unspecified: Secondary | ICD-10-CM

## 2019-08-21 DIAGNOSIS — J4520 Mild intermittent asthma, uncomplicated: Secondary | ICD-10-CM

## 2019-08-21 DIAGNOSIS — Z79899 Other long term (current) drug therapy: Secondary | ICD-10-CM

## 2019-08-21 DIAGNOSIS — F419 Anxiety disorder, unspecified: Secondary | ICD-10-CM

## 2019-08-21 DIAGNOSIS — E785 Hyperlipidemia, unspecified: Secondary | ICD-10-CM | POA: Diagnosis not present

## 2019-08-21 NOTE — Patient Instructions (Addendum)
Goals    . Blood Pressure < 130/80    . LDL CALC < 100        HYPERTENSION INFORMATION  Monitor your blood pressure at home, please keep a record and bring that in with you to your next office visit.   Go to the ER if any CP, SOB, nausea, dizziness, severe HA, changes vision/speech  Testing/Procedures: HOW TO TAKE YOUR BLOOD PRESSURE:  Rest 5 minutes before taking your blood pressure.  Don't smoke or drink caffeinated beverages for at least 30 minutes before.  Take your blood pressure before (not after) you eat.  Sit comfortably with your back supported and both feet on the floor (don't cross your legs).  Elevate your arm to heart level on a table or a desk.  Use the proper sized cuff. It should fit smoothly and snugly around your bare upper arm. There should be enough room to slip a fingertip under the cuff. The bottom edge of the cuff should be 1 inch above the crease of the elbow.   Your most recent BP: BP: 132/80   Take your medications faithfully as instructed. Maintain a healthy weight. Get at least 150 minutes of aerobic exercise per week. Minimize salt intake. Minimize alcohol intake  DASH Eating Plan DASH stands for "Dietary Approaches to Stop Hypertension." The DASH eating plan is a healthy eating plan that has been shown to reduce high blood pressure (hypertension). Additional health benefits may include reducing the risk of type 2 diabetes mellitus, heart disease, and stroke. The DASH eating plan may also help with weight loss. WHAT DO I NEED TO KNOW ABOUT THE DASH EATING PLAN? For the DASH eating plan, you will follow these general guidelines:  Choose foods with a percent daily value for sodium of less than 5% (as listed on the food label).  Use salt-free seasonings or herbs instead of table salt or sea salt.  Check with your health care provider or pharmacist before using salt substitutes.  Eat lower-sodium products, often labeled as "lower sodium" or  "no salt added."  Eat fresh foods.  Eat more vegetables, fruits, and low-fat dairy products.  Choose whole grains. Look for the word "whole" as the first word in the ingredient list.  Choose fish and skinless chicken or [place] more often than red meat. Limit fish, poultry, and meat to 6 oz (170 g) each day.  Limit sweets, desserts, sugars, and sugary drinks.  Choose heart-healthy fats.  Limit cheese to 1 oz (28 g) per day.  Eat more home-cooked food and less restaurant, buffet, and fast food.  Limit fried foods.  Cook foods using methods other than frying.  Limit canned vegetables. If you do use them, rinse them well to decrease the sodium.  When eating at a restaurant, ask that your food be prepared with less salt, or no salt if possible. WHAT FOODS CAN I EAT? Seek help from a dietitian for individual calorie needs. Grains Whole grain or whole wheat bread. Brown rice. Whole grain or whole wheat pasta. Quinoa, bulgur, and whole grain cereals. Low-sodium cereals. Corn or whole wheat flour tortillas. Whole grain cornbread. Whole grain crackers. Low-sodium crackers. Vegetables Fresh or frozen vegetables (raw, steamed, roasted, or grilled). Low-sodium or reduced-sodium tomato and vegetable juices. Low-sodium or reduced-sodium tomato sauce and paste. Low-sodium or reduced-sodium canned vegetables.  Fruits All fresh, canned (in natural juice), or frozen fruits. Meat and Other Protein Products Ground beef (85% or leaner), grass-fed beef, or beef trimmed of fat.  Skinless chicken or [place]. Ground chicken or [place]. Pork trimmed of fat. All fish and seafood. Eggs. Dried beans, peas, or lentils. Unsalted nuts and seeds. Unsalted canned beans. Dairy Low-fat dairy products, such as skim or 1% milk, 2% or reduced-fat cheeses, low-fat ricotta or cottage cheese, or plain low-fat yogurt. Low-sodium or reduced-sodium cheeses. Fats and Oils Tub margarines without trans fats. Light or reduced-fat  mayonnaise and salad dressings (reduced sodium). Avocado. Safflower, olive, or canola oils. Natural peanut or almond butter. Other Unsalted popcorn and pretzels. The items listed above may not be a complete list of recommended foods or beverages. Contact your dietitian for more options. WHAT FOODS ARE NOT RECOMMENDED? Grains White bread. White pasta. White rice. Refined cornbread. Bagels and croissants. Crackers that contain trans fat. Vegetables Creamed or fried vegetables. Vegetables in a cheese sauce. Regular canned vegetables. Regular canned tomato sauce and paste. Regular tomato and vegetable juices. Fruits Dried fruits. Canned fruit in light or heavy syrup. Fruit juice. Meat and Other Protein Products Fatty cuts of meat. Ribs, chicken wings, bacon, sausage, bologna, salami, chitterlings, fatback, hot dogs, bratwurst, and packaged luncheon meats. Salted nuts and seeds. Canned beans with salt. Dairy Whole or 2% milk, cream, half-and-half, and cream cheese. Whole-fat or sweetened yogurt. Full-fat cheeses or blue cheese. Nondairy creamers and whipped toppings. Processed cheese, cheese spreads, or cheese curds. Condiments Onion and garlic salt, seasoned salt, table salt, and sea salt. Canned and packaged gravies. Worcestershire sauce. Tartar sauce. Barbecue sauce. Teriyaki sauce. Soy sauce, including reduced sodium. Steak sauce. Fish sauce. Oyster sauce. Cocktail sauce. Horseradish. Ketchup and mustard. Meat flavorings and tenderizers. Bouillon cubes. Hot sauce. Tabasco sauce. Marinades. Taco seasonings. Relishes. Fats and Oils Butter, stick margarine, lard, shortening, ghee, and bacon fat. Coconut, palm kernel, or palm oils. Regular salad dressings. Other Pickles and olives. Salted popcorn and pretzels. The items listed above may not be a complete list of foods and beverages to avoid. Contact your dietitian for more information. WHERE CAN I FIND MORE INFORMATION? National Heart, Lung, and  Blood Institute: [employer] Document Released: 09/10/2011 Document Revised: 02/05/2014 Document Reviewed: 07/26/2013 [facility] Patient Information 2015 [place], [place]. This information is not intended to replace advice given to you by your health care provider. Make sure you discuss any questions you have with your health care provider.

## 2019-08-22 LAB — URINALYSIS, ROUTINE W REFLEX MICROSCOPIC
Bilirubin Urine: NEGATIVE
Glucose, UA: NEGATIVE
Hgb urine dipstick: NEGATIVE
Ketones, ur: NEGATIVE
Leukocytes,Ua: NEGATIVE
Nitrite: NEGATIVE
Protein, ur: NEGATIVE
Specific Gravity, Urine: 1.029 (ref 1.001–1.03)
pH: 5.5 (ref 5.0–8.0)

## 2019-08-22 LAB — LIPID PANEL
Cholesterol: 228 mg/dL — ABNORMAL HIGH (ref <200)
HDL: 61 mg/dL (ref >=50)
LDL Cholesterol (Calc): 135 mg/dL (calc) — ABNORMAL HIGH
Non-HDL Cholesterol (Calc): 167 mg/dL (calc) — ABNORMAL HIGH (ref <130)
Total CHOL/HDL Ratio: 3.7 (calc) (ref <5.0)
Triglycerides: 183 mg/dL — ABNORMAL HIGH (ref <150)

## 2019-08-22 LAB — CBC WITH DIFFERENTIAL/PLATELET
Absolute Monocytes: 557 cells/uL (ref 200–950)
Basophils Absolute: 48 cells/uL (ref 0–200)
Basophils Relative: 0.5 %
Eosinophils Absolute: 288 cells/uL (ref 15–500)
Eosinophils Relative: 3 %
HCT: 40.6 % (ref 35.0–45.0)
Hemoglobin: 13.6 g/dL (ref 11.7–15.5)
Lymphs Abs: 3072 cells/uL (ref 850–3900)
MCH: 30.2 pg (ref 27.0–33.0)
MCHC: 33.5 g/dL (ref 32.0–36.0)
MCV: 90 fL (ref 80.0–100.0)
MPV: 9.6 fL (ref 7.5–12.5)
Monocytes Relative: 5.8 %
Neutro Abs: 5635 cells/uL (ref 1500–7800)
Neutrophils Relative %: 58.7 %
Platelets: 277 10*3/uL (ref 140–400)
RBC: 4.51 10*6/uL (ref 3.80–5.10)
RDW: 12 % (ref 11.0–15.0)
Total Lymphocyte: 32 %
WBC: 9.6 10*3/uL (ref 3.8–10.8)

## 2019-08-22 LAB — COMPLETE METABOLIC PANEL WITH GFR
AG Ratio: 1.5 (calc) (ref 1.0–2.5)
ALT: 14 U/L (ref 6–29)
AST: 16 U/L (ref 10–30)
Albumin: 4.3 g/dL (ref 3.6–5.1)
Alkaline phosphatase (APISO): 45 U/L (ref 31–125)
BUN: 12 mg/dL (ref 7–25)
CO2: 27 mmol/L (ref 20–32)
Calcium: 9.6 mg/dL (ref 8.6–10.2)
Chloride: 103 mmol/L (ref 98–110)
Creat: 0.73 mg/dL (ref 0.50–1.10)
GFR, Est African American: 128 mL/min/{1.73_m2} (ref >=60)
GFR, Est Non African American: 111 mL/min/{1.73_m2} (ref >=60)
Globulin: 2.9 g/dL (calc) (ref 1.9–3.7)
Glucose, Bld: 79 mg/dL (ref 65–99)
Potassium: 4.3 mmol/L (ref 3.5–5.3)
Sodium: 139 mmol/L (ref 135–146)
Total Bilirubin: 0.6 mg/dL (ref 0.2–1.2)
Total Protein: 7.2 g/dL (ref 6.1–8.1)

## 2019-08-22 LAB — TSH: TSH: 1.85 mIU/L

## 2019-10-05 ENCOUNTER — Other Ambulatory Visit: Payer: Self-pay | Admitting: Adult Health

## 2019-10-05 ENCOUNTER — Other Ambulatory Visit: Payer: Self-pay | Admitting: Internal Medicine

## 2019-10-26 ENCOUNTER — Other Ambulatory Visit: Payer: Self-pay | Admitting: Adult Health

## 2019-10-29 ENCOUNTER — Other Ambulatory Visit: Payer: Self-pay | Admitting: Adult Health

## 2019-11-29 ENCOUNTER — Other Ambulatory Visit: Payer: Self-pay | Admitting: Internal Medicine

## 2019-11-29 MED ORDER — MELOXICAM 15 MG PO TABS: ORAL_TABLET | ORAL | 0 refills | Status: DC

## 2019-12-23 ENCOUNTER — Other Ambulatory Visit: Payer: Self-pay | Admitting: Internal Medicine

## 2020-01-22 ENCOUNTER — Other Ambulatory Visit: Payer: Self-pay | Admitting: Adult Health

## 2020-01-22 MED ORDER — ONDANSETRON 8 MG PO TBDP: 8.0000 mg | ORAL_TABLET | Freq: Three times a day (TID) | ORAL | 0 refills | Status: DC | PRN

## 2020-02-21 ENCOUNTER — Ambulatory Visit: Payer: BC Managed Care – PPO | Admitting: Adult Health

## 2020-02-21 DIAGNOSIS — E559 Vitamin D deficiency, unspecified: Secondary | ICD-10-CM | POA: Insufficient documentation

## 2020-02-21 NOTE — Progress Notes (Deleted)
Assessment and Plan:  Isabel Hubbard was seen today for follow up:   Hypertension, unspecified type Continue medication: nadolol 80 mg daily  Monitor blood pressure at home; call if consistently over 130/80 Continue DASH diet.   Reminder to go to the ER if any CP, SOB, nausea, dizziness, severe HA, changes vision/speech, left arm numbness and tingling and jaw pain.  Anxiety  Continue meds as recommended by psych; starting gabapentin, tapering effexor due to night sweats xanax PRN, vistaril PRN is working well for her Following with counseling Stress management techniques discussed, increase water, good sleep hygiene discussed, increase exercise, and increase veggies.  Reviewed serotonin syndrome sx and risks  Insomnia, unspecified type - good sleep hygiene discussed, increase day time activity Can use melatonin, benadryl to augment, use xanax sparingly if needed -     traZODone (DESYREL) 50 MG tablet; 1-3 tablet for sleep  Migraines Continue BB prophylaxis, HBC Typically triggered by insomnia/stress, sleep hygiene reviewed, trazodone is working well   Asthma Well controlled with current agents; rarely needs inhaler  Hyperlipidemia Currently managing by lifestyle;  Continue low cholesterol diet and exercise.  Check lipid panel.    No orders of the defined types were placed in this encounter.   Further disposition pending results of labs. Discussed med's effects and SE's.   Over 30 minutes of exam, counseling, chart review, and critical decision making was performed.   Future Appointments  Date Time Provider Department Center  02/21/2020  3:45 PM Isabel Gaudier, NP [facility] None    ------------------------------------------------------------------------------------------------------------------  HPI There were no vitals taken for this visit.  29 y.o.female presents for 6 month follow up of anxiety, insomnia, asthma and migraines; she has negative head CT from 01/28/2018 after  she reports "worst headache ever." She follows annually with GYN who does annual comprehensive exams.   She had multiple severe migraines last year that were typically preceded by poor night's rest. Describes headaches as always behind an eye, throbbing pain, photo and phonophobia with nausea.  She is currently on nadolol 80 mg daily for BP and prophylaxis, and on on lo loestrin which controlled her migraines well prior to recent pregnancy (had daughter Isabel Hubbard in March 2019, she is not breastfeeding, delivered at 36 weeks due to HELLP syndrome). She is also on effexor for migraine prophylaxis and has not had many migraines this year, last in Sept 2020 triggered by poor sleep. ***  She has imitrex 100 mg tab as abortive.  She is newly following with a psychologist Dr. Rosemarie Hubbard for anxiety *** She also follows with Isabel Hubbard at [facility], seeing every other week and feels this has been helpful. She reports has been advised to taper off of effexor due to suspicion this is causing night sweats (had unremarkable workup by GYN and endocrinology). She has stopped taking buspar.   She is now taking gabapentin 300 mg AM, 600 mg AM, tapering effexor, and uses xanax PRN (occasional, 2 days in last month) and vistaril 25 mg PRN. Anxiety was up with talks of opening back up schools but better since this has been postponed.  She has been on trazodone 100 mg at night for sleep which is somewhat helpful, often augments with benadryl.   She has asthma currently managed on singulair, rarely needs albuterol.   she has a diagnosis of GERD which is currently managed by pantoprazole 40 mg daily (has been on for 2-3 years since GI initiated). Has been on ranitidine, famotidine, tagamet in the past.  she reports symptoms is currently well controlled, and denies breakthrough reflux, burning in chest, hoarseness or cough.    She has hx of labile BPs, on nadolol 80 mg for BP and migraine,  today their BP is    She does not workout. She denies chest pain, shortness of breath, dizziness.   She is not on cholesterol medication and denies myalgias. Her cholesterol is not at goal. The cholesterol last visit was:   Lab Results  Component Value Date   CHOL 228 (H) 08/21/2019   HDL 61 08/21/2019   LDLCALC 135 (H) 08/21/2019   TRIG 183 (H) 08/21/2019   CHOLHDL 3.7 08/21/2019    She {Has/has not:18111} been working on diet and exercise for prediabetes, and denies {Symptoms; diabetes w/o none:19199}. Last A1C in the office was:  Lab Results  Component Value Date   HGBA1C 5.0 09/01/2016   Patient is on Vitamin D supplement.   Lab Results  Component Value Date   VD25OH 23 (L) 09/01/2016       Past Medical History:  Diagnosis Date  . Allergy   . Anxiety   . Asthma   . Depression   . Hypertension      Allergies  Allergen Reactions  . Dexamethasone Rash  . Sulfa Antibiotics Rash  . Naproxen Other (See Comments)    GERD/Reflux  . Betadine [Povidone Iodine] Rash  . Penicillins Rash  . Prednisone Rash  . Tessalon Perles [Benzonatate] Rash    Current Outpatient Medications on File Prior to Visit  Medication Sig  . albuterol (VENTOLIN HFA) 108 (90 Base) MCG/ACT inhaler Inhale 2 puffs into the lungs every 4 (four) hours as needed for wheezing or shortness of breath.  . ALPRAZolam (XANAX XR) 0.5 MG 24 hr tablet TAKE 1 TABLET BY MOUTH EVERY DAY AS NEEDED FOR ANXIETY  . busPIRone (BUSPAR) 10 MG tablet TAKE 1/2-1 TAB 2-3 TIMES DAILY AS DIRECTED FOR ANXIETY.  Marland Kitchen gabapentin (NEURONTIN) 300 MG capsule Take 300 mg by mouth. Take 300mg  in the morning and 600mg  in the evening  . hydrOXYzine (ATARAX/VISTARIL) 25 MG tablet Take 1-2 tablets (25-50 mg total) by mouth 3 (three) times daily as needed for anxiety (or hives).  . meloxicam (MOBIC) 15 MG tablet TAKE 1/2 TO 1 TABLET DAILY WITH FOOD FOR PAIN & INFLAMMATION  . montelukast (SINGULAIR) 10 MG tablet Take 1 tablet Daily for  Allergies  . nadolol (CORGARD) 80 MG tablet TAKE 1 TABLET DAILY FOR BLOOD PRESSURE & HEADACHE PROPHYLAXIS  . Norethin-Eth Estrad-Fe Biphas (LO LOESTRIN FE PO) Take by mouth.  . ondansetron (ZOFRAN ODT) 8 MG disintegrating tablet Take 1 tablet (8 mg total) by mouth every 8 (eight) hours as needed for nausea or vomiting.  . pantoprazole (PROTONIX) 20 MG tablet TAKE 1 TABLET BY MOUTH TWICE A DAY AS NEEDED FOR REFLUX  . SUMAtriptan (IMITREX) 100 MG tablet Take 1 tablet (100 mg total) by mouth once as needed for migraine. May repeat in 2 hours if headache persists or recurs.  . traZODone (DESYREL) 100 MG tablet TAKE 1/2 TO 1 TABLET BY MOUTH 1 HOUR BEFORE SLEEP  . venlafaxine XR (EFFEXOR-XR) 150 MG 24 hr capsule Take 1 capsule Daily for Mood   No current facility-administered medications on file prior to visit.    ROS: Review of Systems  Constitutional: Negative for malaise/fatigue and weight loss.  HENT: Negative for hearing loss and tinnitus.   Eyes: Negative for blurred vision and double vision.  Respiratory: Negative for cough, shortness  of breath and wheezing.   Cardiovascular: Negative for chest pain, palpitations, orthopnea, claudication and leg swelling.  Gastrointestinal: Negative for abdominal pain, blood in stool, constipation, diarrhea, heartburn, melena, nausea and vomiting.  Genitourinary: Negative.   Musculoskeletal: Negative for joint pain and myalgias.  Skin: Negative for rash.  Neurological: Negative for dizziness, tingling, sensory change, weakness and headaches.  Endo/Heme/Allergies: Negative for polydipsia.       Night sweats  Psychiatric/Behavioral: Negative for depression, memory loss, substance abuse and suicidal ideas. The patient is nervous/anxious and has insomnia.   All other systems reviewed and are negative.    Physical Exam:  There were no vitals taken for this visit.  General Appearance: Well nourished, in no apparent distress. Eyes: PERRLA, EOMs,  conjunctiva no swelling or erythema Sinuses: No Frontal/maxillary tenderness ENT/Mouth: Ext aud canals clear, TMs without erythema, bulging. No erythema, swelling, or exudate on post pharynx.  Tonsils not swollen or erythematous. Hearing normal.  Neck: Supple, thyroid normal.  Respiratory: Respiratory effort normal, BS equal bilaterally without rales, rhonchi, wheezing or stridor.  Cardio: RRR with no MRGs. Brisk peripheral pulses without edema.  Abdomen: Soft, + BS.  Non tender, no guarding, rebound, hernias, masses. Lymphatics: Non tender without lymphadenopathy.  Musculoskeletal: Full ROM, 5/5 strength, normal gait.  Skin: Warm, dry without rashes, lesions, ecchymosis.  Neuro: Cranial nerves intact. Normal muscle tone, no cerebellar symptoms. Sensation intact.  Psych: Awake and oriented X 3, normal affect, Insight and Judgment appropriate.     Dan Maker, NP 8:46 AM [facility]

## 2020-04-18 NOTE — Progress Notes (Signed)
Assessment and Plan:  Teriah was seen today for follow up:   Hypertension, unspecified type Continue medication: nadolol 80 mg daily  Monitor blood pressure at home; call if consistently over 130/80 Continue DASH diet.   Reminder to go to the ER if any CP, SOB, nausea, dizziness, severe HA, changes vision/speech, left arm numbness and tingling and jaw pain.  Anxiety  Currently with fair control on wellbutrin 300 mg daily, xanax PRN use is rare Following with counseling Stress management techniques discussed, increase water, good sleep hygiene discussed, increase exercise, and increase veggies.  Reviewed serotonin syndrome sx and risks  Insomnia, unspecified type - good sleep hygiene discussed, increase day time activity -     Suvorexant (BELSOMRA) 10 MG TABS; Take 10 mg by mouth at bedtime. For sleep.  Migraines Continue BB prophylaxis, HBC Typically triggered by insomnia/stress, sleep hygiene reviewed, will try belsomra  Asthma Well controlled with current agents; rarely needs inhaler  Hyperlipidemia Currently managing by lifestyle;  Continue low cholesterol diet and exercise.  Check lipid panel.   Vitamin D Def/ osteoporosis prevention Recommend supplementation for goal range 60-100 Check vitamin D level   Further disposition pending results of labs. Discussed med's effects and SE's.   Over 30 minutes of exam, counseling, chart review, and critical decision making was performed.   Future Appointments  Date Time Provider Department Center  10/24/2020  3:30 PM Judd Gaudier, NP [facility] None    ------------------------------------------------------------------------------------------------------------------  HPI BP 110/72   Pulse 69   Temp (!) 97.3 F (36.3 C)   Ht 5\' 8"  (1.727 m)   Wt 167 lb (75.8 kg)   SpO2 96%   BMI 25.39 kg/m   29 y.o.female presents for 6 month follow up of anxiety, insomnia, asthma and migraines; she has negative head CT from  01/28/2018 after she reports "worst headache ever." She follows annually with GYN but unsure what labs have been done recently and requests labs be done today.   She migraines typically preceded by poor night's rest. She is currently on nadolol 80 mg daily for BP and prophylaxis, and on on lo loestrin which controlled her migraines well prior to last pregnancy (had daughter 01/30/2018 in March 2019, delivered at 36 weeks due to HELLP syndrome). She has imitrex 100 mg tab as abortive.  She was also on effexor for migraine prophylaxis but tapered off due to night sweats after unremarkable workup by GYN and endocrinology.   She is newly following with a psychologist Dr. 12-12-2005 for anxiety due to poor control despite numerous agents. She also follows with Rosemarie Beath at [facility], seeing every other week and feels this has been helpful. Now on wellbutrin 300 XR and doing fairly. Was on prozac for a period and gained 17 lb. Xanax PRN occasionally, only 1 in the last 2 months, but stressed about how she will feel when school opens up. Also currently taking zaleplon 20 mg for sleep, has failed numerous others, reports this helps with onset and first 4 hours, but then tends to wake up and struggles to stay asleep. She would like to try something else.   She has asthma currently managed on singulair, rarely needs albuterol.   she has a diagnosis of GERD which is currently managed by pantoprazole 40 mg daily (has been on for 2-3 years since GI initiated). Has been on ranitidine, famotidine, tagamet in the past.  she reports symptoms is currently well controlled, and denies breakthrough reflux, burning in chest, hoarseness  or cough.    She has hx of labile BPs, on nadolol 80 mg for BP and migraine, today their BP is BP: 110/72  She does not workout. She denies chest pain, shortness of breath, dizziness.   She is not on cholesterol medication and denies myalgias. Her cholesterol is  not at goal. The cholesterol last visit was:   Lab Results  Component Value Date   CHOL 228 (H) 08/21/2019   HDL 61 08/21/2019   LDLCALC 135 (H) 08/21/2019   TRIG 183 (H) 08/21/2019   CHOLHDL 3.7 08/21/2019   BMI is Body mass index is 25.39 kg/m., she has been working on diet but limited exercise, busy with toddler. She reports working on low carb diet, admits to 2-3 servings only of fruits and veggies daily.  Wt Readings from Last 3 Encounters:  04/19/20 167 lb (75.8 kg)  08/21/19 164 lb (74.4 kg)  07/03/19 162 lb (73.5 kg)   Patient is not currently on Vitamin D supplement.  Requesting check.  Lab Results  Component Value Date   VD25OH 23 (L) 09/01/2016       Past Medical History:  Diagnosis Date  . Allergy   . Anxiety   . Asthma   . Depression   . Hypertension      Allergies  Allergen Reactions  . Dexamethasone Rash  . Sulfa Antibiotics Rash  . Naproxen Other (See Comments)    GERD/Reflux  . Betadine [Povidone Iodine] Rash  . Penicillins Rash  . Prednisone Rash  . Tessalon Perles [Benzonatate] Rash    Current Outpatient Medications on File Prior to Visit  Medication Sig  . albuterol (VENTOLIN HFA) 108 (90 Base) MCG/ACT inhaler Inhale 2 puffs into the lungs every 4 (four) hours as needed for wheezing or shortness of breath.  . ALPRAZolam (XANAX XR) 0.5 MG 24 hr tablet TAKE 1 TABLET BY MOUTH EVERY DAY AS NEEDED FOR ANXIETY  . buPROPion (WELLBUTRIN XL) 300 MG 24 hr tablet Take 300 mg by mouth daily.  . montelukast (SINGULAIR) 10 MG tablet Take 1 tablet Daily for Allergies  . nadolol (CORGARD) 80 MG tablet TAKE 1 TABLET DAILY FOR BLOOD PRESSURE & HEADACHE PROPHYLAXIS  . Norethin-Eth Estrad-Fe Biphas (LO LOESTRIN FE PO) Take by mouth.  . pantoprazole (PROTONIX) 20 MG tablet TAKE 1 TABLET BY MOUTH TWICE A DAY AS NEEDED FOR REFLUX  . SUMAtriptan (IMITREX) 100 MG tablet Take 1 tablet (100 mg total) by mouth once as needed for migraine. May repeat in 2 hours if headache  persists or recurs.  . ondansetron (ZOFRAN ODT) 8 MG disintegrating tablet Take 1 tablet (8 mg total) by mouth every 8 (eight) hours as needed for nausea or vomiting. (Patient not taking: Reported on 04/19/2020)   No current facility-administered medications on file prior to visit.    ROS: Review of Systems  Constitutional: Negative for malaise/fatigue and weight loss.  HENT: Negative for hearing loss and tinnitus.   Eyes: Negative for blurred vision and double vision.  Respiratory: Negative for cough, shortness of breath and wheezing.   Cardiovascular: Negative for chest pain, palpitations, orthopnea, claudication and leg swelling.  Gastrointestinal: Negative for abdominal pain, blood in stool, constipation, diarrhea, heartburn, melena, nausea and vomiting.  Genitourinary: Negative.   Musculoskeletal: Negative for joint pain and myalgias.  Skin: Negative for rash.  Neurological: Negative for dizziness, tingling, sensory change, weakness and headaches.  Endo/Heme/Allergies: Negative for polydipsia.  Psychiatric/Behavioral: Negative for depression, memory loss, substance abuse and suicidal ideas. The patient  is nervous/anxious and has insomnia.   All other systems reviewed and are negative.    Physical Exam:  BP 110/72   Pulse 69   Temp (!) 97.3 F (36.3 C)   Ht 5\' 8"  (1.727 m)   Wt 167 lb (75.8 kg)   SpO2 96%   BMI 25.39 kg/m   General Appearance: Well nourished, in no apparent distress. Eyes: PERRLA, EOMs, conjunctiva no swelling or erythema Sinuses: No Frontal/maxillary tenderness ENT/Mouth: Ext aud canals clear, TMs without erythema, bulging. No erythema, swelling, or exudate on post pharynx.  Tonsils not swollen or erythematous. Hearing normal.  Neck: Supple, thyroid normal.  Respiratory: Respiratory effort normal, BS equal bilaterally without rales, rhonchi, wheezing or stridor.  Cardio: RRR with no MRGs. Brisk peripheral pulses without edema.  Abdomen: Soft, + BS.  Non  tender, no guarding, rebound, hernias, masses. Lymphatics: Non tender without lymphadenopathy.  Musculoskeletal: Full ROM, 5/5 strength, normal gait.  Skin: Warm, dry without rashes, lesions, ecchymosis.  Neuro: Cranial nerves intact. Normal muscle tone, no cerebellar symptoms. Sensation intact.  Psych: Awake and oriented X 3, normal affect, Insight and Judgment appropriate.     , NP 11:45 AM [facility]

## 2020-04-19 ENCOUNTER — Encounter: Payer: Self-pay | Admitting: Adult Health

## 2020-04-19 ENCOUNTER — Other Ambulatory Visit: Payer: Self-pay

## 2020-04-19 ENCOUNTER — Ambulatory Visit (INDEPENDENT_AMBULATORY_CARE_PROVIDER_SITE_OTHER): Payer: BC Managed Care – PPO | Admitting: Adult Health

## 2020-04-19 VITALS — BP 110/72 | HR 69 | Temp 97.3°F | Ht 68.0 in | Wt 167.0 lb

## 2020-04-19 DIAGNOSIS — I10 Essential (primary) hypertension: Secondary | ICD-10-CM | POA: Diagnosis not present

## 2020-04-19 DIAGNOSIS — G4700 Insomnia, unspecified: Secondary | ICD-10-CM

## 2020-04-19 DIAGNOSIS — G43919 Migraine, unspecified, intractable, without status migrainosus: Secondary | ICD-10-CM | POA: Diagnosis not present

## 2020-04-19 DIAGNOSIS — Z79899 Other long term (current) drug therapy: Secondary | ICD-10-CM

## 2020-04-19 DIAGNOSIS — E538 Deficiency of other specified B group vitamins: Secondary | ICD-10-CM | POA: Diagnosis not present

## 2020-04-19 DIAGNOSIS — F419 Anxiety disorder, unspecified: Secondary | ICD-10-CM

## 2020-04-19 DIAGNOSIS — E785 Hyperlipidemia, unspecified: Secondary | ICD-10-CM | POA: Diagnosis not present

## 2020-04-19 DIAGNOSIS — E559 Vitamin D deficiency, unspecified: Secondary | ICD-10-CM

## 2020-04-19 MED ORDER — BELSOMRA 10 MG PO TABS
10.0000 mg | ORAL_TABLET | Freq: Every day | ORAL | 0 refills | Status: DC
Start: 2020-04-19 — End: 2020-06-11

## 2020-04-19 NOTE — Patient Instructions (Addendum)
Goals     Blood Pressure < 130/80     LDL CALC < 100       Soluble fiber supplement - citrucel or benefit  Switch to olive oil or avocado    High-Fiber Diet Fiber, also called dietary fiber, is a type of carbohydrate that is found in fruits, vegetables, whole grains, and beans. A high-fiber diet can have many health benefits. Your health care provider may recommend a high-fiber diet to help:  Prevent constipation. Fiber can make your bowel movements more regular.  Lower your cholesterol.  Relieve the following conditions: ? Swelling of veins in the anus (hemorrhoids). ? Swelling and irritation (inflammation) of specific areas of the digestive tract (uncomplicated diverticulosis). ? A problem of the large intestine (colon) that sometimes causes pain and diarrhea (irritable bowel syndrome, IBS).  Prevent overeating as part of a weight-loss plan.  Prevent heart disease, type 2 diabetes, and certain cancers. What is my plan? The recommended daily fiber intake in grams (g) includes:  38 g for men age 29 or younger.  30 g for men over age 29.  25 g for women age 67 or younger.  21 g for women over age 64. You can get the recommended daily intake of dietary fiber by:  Eating a variety of fruits, vegetables, grains, and beans.  Taking a fiber supplement, if it is not possible to get enough fiber through your diet. What do I need to know about a high-fiber diet?  It is better to get fiber through food sources rather than from fiber supplements. There is not a lot of research about how effective supplements are.  Always check the fiber content on the nutrition facts label of any prepackaged food. Look for foods that contain 5 g of fiber or more per serving.  Talk with a diet and nutrition specialist (dietitian) if you have questions about specific foods that are recommended or not recommended for your medical condition, especially if those foods are not listed  below.  Gradually increase how much fiber you consume. If you increase your intake of dietary fiber too quickly, you may have bloating, cramping, or gas.  Drink plenty of water. Water helps you to digest fiber. What are tips for following this plan?  Eat a wide variety of high-fiber foods.  Make sure that half of the grains that you eat each day are whole grains.  Eat breads and cereals that are made with whole-grain flour instead of refined flour or white flour.  Eat brown rice, bulgur wheat, or millet instead of white rice.  Start the day with a breakfast that is high in fiber, such as a cereal that contains 5 g of fiber or more per serving.  Use beans in place of meat in soups, salads, and pasta dishes.  Eat high-fiber snacks, such as berries, raw vegetables, nuts, and popcorn.  Choose whole fruits and vegetables instead of processed forms like juice or sauce. What foods can I eat?  Fruits Berries. Pears. Apples. Oranges. Avocado. Prunes and raisins. Dried figs. Vegetables Sweet potatoes. Spinach. Kale. Artichokes. Cabbage. Broccoli. Cauliflower. Green peas. Carrots. Squash. Grains Whole-grain breads. Multigrain cereal. Oats and oatmeal. Brown rice. Barley. Bulgur wheat. Millet. Quinoa. Bran muffins. Popcorn. Rye wafer crackers. Meats and other proteins Navy, kidney, and pinto beans. Soybeans. Split peas. Lentils. Nuts and seeds. Dairy Fiber-fortified yogurt. Beverages Fiber-fortified soy milk. Fiber-fortified orange juice. Other foods Fiber bars. The items listed above may not be a complete list of recommended  foods and beverages. Contact a dietitian for more options. What foods are not recommended? Fruits Fruit juice. Cooked, strained fruit. Vegetables Fried potatoes. Canned vegetables. Well-cooked vegetables. Grains White bread. Pasta made with refined flour. White rice. Meats and other proteins Fatty cuts of meat. Fried chicken or fried fish. Dairy Milk.  Yogurt. Cream cheese. Sour cream. Fats and oils Butters. Beverages Soft drinks. Other foods Cakes and pastries. The items listed above may not be a complete list of foods and beverages to avoid. Contact a dietitian for more information. Summary  Fiber is a type of carbohydrate. It is found in fruits, vegetables, whole grains, and beans.  There are many health benefits of eating a high-fiber diet, such as preventing constipation, lowering blood cholesterol, helping with weight loss, and reducing your risk of heart disease, diabetes, and certain cancers.  Gradually increase your intake of fiber. Increasing too fast can result in cramping, bloating, and gas. Drink plenty of water while you increase your fiber.  The best sources of fiber include whole fruits and vegetables, whole grains, nuts, seeds, and beans. This information is not intended to replace advice given to you by your health care provider. Make sure you discuss any questions you have with your health care provider. Document Revised: 07/26/2017 Document Reviewed: 07/26/2017 Elsevier Patient Education  2020 [employer].

## 2020-04-20 ENCOUNTER — Encounter: Payer: Self-pay | Admitting: Adult Health

## 2020-04-20 DIAGNOSIS — E538 Deficiency of other specified B group vitamins: Secondary | ICD-10-CM | POA: Insufficient documentation

## 2020-04-20 LAB — COMPLETE METABOLIC PANEL WITH GFR
AG Ratio: 1.5 (calc) (ref 1.0–2.5)
ALT: 19 U/L (ref 6–29)
AST: 15 U/L (ref 10–30)
Albumin: 4.1 g/dL (ref 3.6–5.1)
Alkaline phosphatase (APISO): 58 U/L (ref 31–125)
BUN: 10 mg/dL (ref 7–25)
CO2: 27 mmol/L (ref 20–32)
Calcium: 9.6 mg/dL (ref 8.6–10.2)
Chloride: 106 mmol/L (ref 98–110)
Creat: 0.68 mg/dL (ref 0.50–1.10)
GFR, Est African American: 135 mL/min/{1.73_m2} (ref >=60)
GFR, Est Non African American: 117 mL/min/{1.73_m2} (ref >=60)
Globulin: 2.8 g/dL (calc) (ref 1.9–3.7)
Glucose, Bld: 86 mg/dL (ref 65–99)
Potassium: 4.6 mmol/L (ref 3.5–5.3)
Sodium: 141 mmol/L (ref 135–146)
Total Bilirubin: 0.3 mg/dL (ref 0.2–1.2)
Total Protein: 6.9 g/dL (ref 6.1–8.1)

## 2020-04-20 LAB — VITAMIN B12: Vitamin B-12: 298 pg/mL (ref 200–1100)

## 2020-04-20 LAB — MAGNESIUM: Magnesium: 1.9 mg/dL (ref 1.5–2.5)

## 2020-04-20 LAB — LIPID PANEL
Cholesterol: 225 mg/dL — ABNORMAL HIGH (ref <200)
HDL: 62 mg/dL (ref >=50)
LDL Cholesterol (Calc): 141 mg/dL (calc) — ABNORMAL HIGH
Non-HDL Cholesterol (Calc): 163 mg/dL (calc) — ABNORMAL HIGH (ref <130)
Total CHOL/HDL Ratio: 3.6 (calc) (ref <5.0)
Triglycerides: 105 mg/dL (ref <150)

## 2020-04-20 LAB — VITAMIN D 25 HYDROXY (VIT D DEFICIENCY, FRACTURES): Vit D, 25-Hydroxy: 37 ng/mL (ref 30–100)

## 2020-04-28 ENCOUNTER — Other Ambulatory Visit: Payer: Self-pay | Admitting: Adult Health

## 2020-04-28 ENCOUNTER — Other Ambulatory Visit: Payer: Self-pay | Admitting: Internal Medicine

## 2020-05-08 ENCOUNTER — Ambulatory Visit: Payer: BC Managed Care – PPO | Attending: Obstetrics & Gynecology | Admitting: Obstetrics and Gynecology

## 2020-05-08 ENCOUNTER — Other Ambulatory Visit: Payer: Self-pay

## 2020-05-08 ENCOUNTER — Ambulatory Visit: Payer: BC Managed Care – PPO | Admitting: *Deleted

## 2020-05-08 DIAGNOSIS — Z888 Allergy status to other drugs, medicaments and biological substances status: Secondary | ICD-10-CM | POA: Insufficient documentation

## 2020-05-08 DIAGNOSIS — G43709 Chronic migraine without aura, not intractable, without status migrainosus: Secondary | ICD-10-CM | POA: Diagnosis not present

## 2020-05-08 DIAGNOSIS — F329 Major depressive disorder, single episode, unspecified: Secondary | ICD-10-CM | POA: Diagnosis not present

## 2020-05-08 DIAGNOSIS — Z8759 Personal history of other complications of pregnancy, childbirth and the puerperium: Secondary | ICD-10-CM | POA: Diagnosis not present

## 2020-05-08 DIAGNOSIS — F419 Anxiety disorder, unspecified: Secondary | ICD-10-CM | POA: Insufficient documentation

## 2020-05-08 DIAGNOSIS — G43909 Migraine, unspecified, not intractable, without status migrainosus: Secondary | ICD-10-CM | POA: Diagnosis not present

## 2020-05-08 DIAGNOSIS — Z8751 Personal history of pre-term labor: Secondary | ICD-10-CM | POA: Insufficient documentation

## 2020-05-08 DIAGNOSIS — J4520 Mild intermittent asthma, uncomplicated: Secondary | ICD-10-CM | POA: Diagnosis not present

## 2020-05-08 DIAGNOSIS — I10 Essential (primary) hypertension: Secondary | ICD-10-CM | POA: Diagnosis present

## 2020-05-08 DIAGNOSIS — Z9049 Acquired absence of other specified parts of digestive tract: Secondary | ICD-10-CM | POA: Insufficient documentation

## 2020-05-08 DIAGNOSIS — O09299 Supervision of pregnancy with other poor reproductive or obstetric history, unspecified trimester: Secondary | ICD-10-CM

## 2020-05-08 DIAGNOSIS — Z882 Allergy status to sulfonamides status: Secondary | ICD-10-CM | POA: Diagnosis not present

## 2020-05-08 NOTE — Progress Notes (Signed)
BP 136/95, pulse 59

## 2020-05-08 NOTE — Progress Notes (Addendum)
Maternal-Fetal Medicine   Name: Isabel Hubbard DOB: Jul 26, 1991 MRN: [id number] Referring Provider: Shea Evans, MD  I had the pleasure of seeing Isabel Hubbard, G2 [id number], [facility] at [facility].  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  Obstetric history is significant for a spontaneous preterm delivery in March 2019 at [facility] at [redacted] weeks gestation of a female infant weighing 3 pounds 13 ounces.  She had her prenatal care at your office.  At [redacted] weeks gestation she was evaluated at the MAU with increased blood pressure.  Abnormal labs including AST 128, ALT 105 and platelets 104 were seen.  Because of lack of availability of NICU beds, the patient was transferred to [facility].  Patient reports she received antenatal corticosteroids and had a spontaneous delivery the following day.  The infant stayed in the NICU for 10 days and was discharged in good health.  She is currently in good health.  Her pregnancy was complicated by chronic hypertension and the patient was taking labetalol 200 mg twice daily.  Past medical history: Chronic hypertension.  Well controlled without medications.  Patient reports her blood pressures have been within normal range at home.  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 Migraines, mild intermittent asthma, anxiety/depression.  She had migraines for several years and the last episode was about 8 months ago.  It is usually unilateral and she has had pains in her eyeballs.  No aura is associated with a migraine.  It was related to her menstrual cycle and sleep disturbances.  Patient takes nadolol (preventive) for both hypertension and migraine.  Patient received 2 doses of Covid vaccine this year.  She has anxiety and is being followed by her psychiatrist Dr. Pincus Badder.  Patient takes Wellbutrin and Sonata (sleep).  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 She also has intermittent asthma that is stable.  She take Singulair and albuterol (as needed).  Past surgical history: Laparoscopic cholecystectomy in 2017, appendectomy in 2000. Medications: Nadolol, Wellbutrin, Singulair, prenatal vitamins, oral contraceptives (continuous loestrin), Protonix, zaleplon (sonata) for sleep. Allergies: Prednisone (rashes), sulfa drugs (rashes), Naprosyn (rashes). Social history denies tobacco or drug use.  Drinks alcohol on social occasions.  She teaches second grade students.  She is married and her husband is in good health except for hypercholesterolemia. Family history: No history of venous thromboembolism in the family both parents are in good health. GYN history: No history of abnormal Pap smears or cervical surgeries.  No history of breast disease.  She does not get menstrual cycles because of oral contraceptives  Blood pressure today at our office is 136/95 mmHg.  Pulse 59/minute.  Our concerns include: History of help syndrome and fetal growth restriction: Patients with HELLP syndrome and previous pregnancy have a recurrence rate of 5% to 7% and subsequent pregnancies.  Risk of preeclampsia is higher (up to 25%).  Only low-dose aspirin has proven beneficial in preventing or delaying preeclampsia.  I recommend low-dose aspirin to be initiated at [redacted] weeks gestation and continue throughout pregnancy till delivery.  I recommend 2 tablets of aspirin (162 mg) because of her history of HELLP syndrome and chronic hypertension.  Patient does not have contraindications to aspirin.  Monitoring of liver enzymes is not necessary. Fetal growth restriction in previous pregnancy is most likely from chronic hypertension and preeclampsia.  We recommend serial fetal growth assessments every 4 weeks from [redacted]  weeks gestation till delivery.  Migraine: Her migraine is mainly related to menstrual cycles and sleep disturbances.  Pregnancy should see decrease in  the episodes of migraine.  She does not have any obvious trigger factors.  Nadolol is a preventive drug for migraine and help controlling blood pressure.  Beta-blockers have shown to decrease fetal weights in some fetuses.  Alternatively, labetalol can be instituted before conception. Most of medications used for the treatment of migraine can be safely used in pregnancy including triptans.  Ergotamine is contraindicated in pregnancy.  Chronic hypertension: Adverse effects of chronic hypertension include fetal growth restriction, placental abruption and maternal complications including stroke.  Beta-blockers have establish the safety in pregnancy and should be continued throughout pregnancy.  Serial fetal growth assessments should be performed and weekly antenatal testing beginning at [redacted] weeks gestation should be initiated.  Preconception: I discussed the benefit of folic acid 400 mcg daily starting at least 1 month before conception (preferably 3 months before conception) to reduce the likelihood of open spina bifida in the fetus. Zaleplon: Insufficient information is available on its use in pregnancy. Although no increased congenital malformations are expected, an alternative medication may be tried (as recommended by her psychiatrist). We discussed avoidance of caffeine and electronic devices before sleep.   Recommendations: -I reassured her that she does not have any risk factors to avoid pregnancy. -Preconception folic acid 400 micrograms daily (at least one month before conception). -Low-dose aspirin (162 mg) from 12-weeks' gestation till delivery. -Follow-up with her psychiatrist. -Continue nadalol and switch to labetalol at conception if possible. -Detailed fetal anatomy scan at 18 to 20 weeks. -Serial fetal growth assessments every 4 weeks from 24 weeks. -Weekly BPP from 32 weeks till delivery.  Thank you for consultation.  If you have any questions, please contact me at the [facility].  Consultation including face-to-face counseling 45 minutes.

## 2020-05-28 ENCOUNTER — Other Ambulatory Visit: Payer: Self-pay | Admitting: Adult Health

## 2020-05-28 MED ORDER — FAMOTIDINE 20 MG PO TABS
20.0000 mg | ORAL_TABLET | Freq: Two times a day (BID) | ORAL | 1 refills | Status: DC
Start: 2020-05-28 — End: 2020-06-24

## 2020-05-28 MED ORDER — SUCRALFATE 1 G PO TABS
ORAL_TABLET | ORAL | 1 refills | Status: DC
Start: 2020-05-28 — End: 2021-01-31

## 2020-06-10 ENCOUNTER — Other Ambulatory Visit: Payer: Self-pay | Admitting: Adult Health

## 2020-06-24 ENCOUNTER — Other Ambulatory Visit: Payer: Self-pay | Admitting: Adult Health

## 2020-07-02 ENCOUNTER — Encounter: Payer: Self-pay | Admitting: Adult Health

## 2020-07-02 DIAGNOSIS — U071 COVID-19: Secondary | ICD-10-CM | POA: Insufficient documentation

## 2020-07-03 ENCOUNTER — Ambulatory Visit: Payer: BC Managed Care – PPO | Admitting: Adult Health

## 2020-07-03 ENCOUNTER — Encounter: Payer: Self-pay | Admitting: Adult Health

## 2020-07-03 ENCOUNTER — Other Ambulatory Visit: Payer: Self-pay

## 2020-07-03 ENCOUNTER — Other Ambulatory Visit: Payer: Self-pay | Admitting: Adult Health

## 2020-07-03 DIAGNOSIS — U071 COVID-19: Secondary | ICD-10-CM | POA: Diagnosis not present

## 2020-07-03 DIAGNOSIS — J029 Acute pharyngitis, unspecified: Secondary | ICD-10-CM

## 2020-07-03 DIAGNOSIS — J040 Acute laryngitis: Secondary | ICD-10-CM

## 2020-07-03 MED ORDER — FLUCONAZOLE 150 MG PO TABS: 150.0000 mg | ORAL_TABLET | Freq: Once | ORAL | 3 refills | Status: AC

## 2020-07-03 MED ORDER — PREDNISONE 20 MG PO TABS
ORAL_TABLET | ORAL | 0 refills | Status: DC
Start: 2020-07-03 — End: 2021-01-31

## 2020-07-03 MED ORDER — AZITHROMYCIN 250 MG PO TABS: ORAL_TABLET | ORAL | 1 refills | Status: AC

## 2020-07-03 MED ORDER — PROMETHAZINE-CODEINE 6.25-10 MG/5ML PO SYRP: 5.0000 mL | ORAL_SOLUTION | Freq: Four times a day (QID) | ORAL | 0 refills | Status: DC | PRN

## 2020-07-03 NOTE — Progress Notes (Signed)
THIS ENCOUNTER IS A VIRTUAL VISIT DUE TO COVID-19 - PATIENT WAS NOT SEEN IN THE OFFICE.  PATIENT HAS CONSENTED TO VIRTUAL VISIT / TELEMEDICINE VISIT   Virtual Visit via telephone Note  I connected with Isabel Hubbard on 07/03/2020 by telephone.  I verified that I am speaking with the correct person using two identifiers.    I discussed the limitations of evaluation and management by telemedicine and the availability of in person appointments. The patient expressed understanding and agreed to proceed.  History of Present Illness:  There were no vitals taken for this visit.  29 y.o. patient with hx of mild asthma contacted office due positive covid 19. This patient is vaccinated for covid 19 - 2/2, moderna in March  Sx began 6 days ago with sore throat and hoarseness, continued through the weekend, had drive through test on 7/82/9562 which was positive. She reports since then has had worsening hoarseness, pain with swallowing, now with mildly productive cough, thick, green. Sore throat also worse, very painful to swallow/eat. Denies pooling of saliva. Feels some chest discomfort after coughing/hacking episodes, will have some dyspnea if gets up and walks briskly.   Has had mild temp ~99.5, none in the last few days Doesn't have pulse ox yet, but ordered one today   She denies HA, dizziness, wheezing, GI sx, sinus pressure/drainage, rash, myalgia/arthralgia  Has been taking dylsym, also mucinex, some benefit but cough and sore throat remain severe, reports throat "very red, but hurts worse than when I had strep."   Exposures: 29 year old was symptomatic 2 days prior to her, was treated by pediatrician   Medications  Current Outpatient Medications (Endocrine & Metabolic):  Marland Kitchen  Norethin-Eth Estrad-Fe Biphas (LO LOESTRIN FE PO), Take by mouth.  Current Outpatient Medications (Cardiovascular):  .  nadolol (CORGARD) 80 MG tablet, TAKE 1 TABLET DAILY FOR BLOOD PRESSURE & HEADACHE  PROPHYLAXIS  Current Outpatient Medications (Respiratory):  .  albuterol (VENTOLIN HFA) 108 (90 Base) MCG/ACT inhaler, Inhale 2 puffs into the lungs every 4 (four) hours as needed for wheezing or shortness of breath. .  montelukast (SINGULAIR) 10 MG tablet, Take 1 tablet Daily for Allergies (Patient not taking: Reported on 07/03/2020)  Current Outpatient Medications (Analgesics):  Marland Kitchen  SUMAtriptan (IMITREX) 100 MG tablet, Take 1 tablet (100 mg total) by mouth once as needed for migraine. May repeat in 2 hours if headache persists or recurs.   Current Outpatient Medications (Other):  Marland Kitchen  buPROPion (WELLBUTRIN XL) 300 MG 24 hr tablet, Take 300 mg by mouth daily. Marland Kitchen  ALPRAZolam (XANAX XR) 0.5 MG 24 hr tablet, TAKE 1 TABLET BY MOUTH EVERY DAY AS NEEDED FOR ANXIETY (Patient not taking: Reported on 07/03/2020) .  BELSOMRA 10 MG TABS, TAKE 1 TABLET BY MOUTH AT BEDTIME. FOR SLEEP. (Patient not taking: Reported on 07/03/2020) .  famotidine (PEPCID) 20 MG tablet, TAKE 1 TABLET BY MOUTH TWICE A DAY (Patient not taking: Reported on 07/03/2020) .  ondansetron (ZOFRAN ODT) 8 MG disintegrating tablet, Take 1 tablet (8 mg total) by mouth every 8 (eight) hours as needed for nausea or vomiting. (Patient not taking: Reported on 04/19/2020) .  pantoprazole (PROTONIX) 20 MG tablet, TAKE 1 TABLET BY MOUTH TWICE A DAY AS NEEDED FOR REFLUX (Patient not taking: Reported on 07/03/2020) .  sucralfate (CARAFATE) 1 g tablet, Take 1 tab 30 min before each meal and bedtime for acid reflux. If having burning in esophagus, dissolve tab in 2-3 oz of water and take as  a liquid. (Patient not taking: Reported on 07/03/2020)  Allergies:  Allergies  Allergen Reactions  . Dexamethasone Rash  . Sulfa Antibiotics Rash  . Naproxen Other (See Comments)    GERD/Reflux  . Betadine [Povidone Iodine] Rash  . Penicillins Rash  . Prednisone Rash  . Tessalon Perles [Benzonatate] Rash    Problem list She has Asthma; Migraines; Hypertension;  Anxiety; Insomnia; Acid reflux; Hyperlipidemia; Vitamin D deficiency; B12 deficiency; and COVID-19 (07/01/2020) on their problem list.   Social History:   reports that she has never smoked. She has never used smokeless tobacco. She reports that she does not drink alcohol and does not use drugs.   Observations/Objective:  General : Fatigued sounding patient in no apparent distress HEENT: Very hoarse vocal quality, no cough for duration of visit Lungs: speaks in complete sentences, no audible wheezing, no apparent distress Neurological: alert, oriented x 3 Psychiatric: pleasant, judgement appropriate   Assessment and Plan:  COVID-19  Covid 19 positive per rapid screening test in vaccinated Currently symptoms are mild/mod Risk factors include asthma Regular breathing exercises, proning EC bASA daily for clot prevention unless contraindicated, regular walking/calf exercises Take tylenol PRN temp 101+ Push hydration Sx supportive therapy Steroid taper was offered Immune support with vitamin C, zinc, vitamin D reviewed Will send in codeine cough syrup due to poor control with current agent Will send in presumptive zpak due to severity of throat sx; cover superimposed bacterial pharyngitis Pharyngitis: take medicationss as prescribed, increase fluids,  Salt water gargles. Follow up via mychart or telephone if needed Advised patient obtain O2 monitor; present to ED if persistently <88% or with severe dyspnea, any CP, fever uncontrolled by tylenol, confusion, sudden decline Should remain in isolation until at least 10 days from onset of sx, 24-48 hours fever free without tylenol, sx such as cough are improved.    Follow Up Instructions:  I discussed the assessment and treatment plan with the patient. The patient was provided an opportunity to ask questions and all were answered. The patient agreed with the plan and demonstrated an understanding of the instructions.   The patient was  advised to call back or seek an in-person evaluation if the symptoms worsen or if the condition fails to improve as anticipated.  I provided 15 minutes of non-face-to-face time during this encounter.   Dan Maker, NP

## 2020-07-27 ENCOUNTER — Other Ambulatory Visit: Payer: Self-pay | Admitting: Internal Medicine

## 2020-10-24 ENCOUNTER — Ambulatory Visit: Payer: BC Managed Care – PPO | Admitting: Adult Health Nurse Practitioner

## 2021-01-31 ENCOUNTER — Encounter: Payer: Self-pay | Admitting: Adult Health

## 2021-01-31 ENCOUNTER — Ambulatory Visit (INDEPENDENT_AMBULATORY_CARE_PROVIDER_SITE_OTHER): Payer: PRIVATE HEALTH INSURANCE | Admitting: Adult Health

## 2021-01-31 ENCOUNTER — Other Ambulatory Visit: Payer: Self-pay

## 2021-01-31 VITALS — BP 132/84 | HR 65 | Temp 97.9°F | Ht 68.0 in | Wt 176.0 lb

## 2021-01-31 DIAGNOSIS — I10 Essential (primary) hypertension: Secondary | ICD-10-CM | POA: Diagnosis not present

## 2021-01-31 DIAGNOSIS — K219 Gastro-esophageal reflux disease without esophagitis: Secondary | ICD-10-CM

## 2021-01-31 DIAGNOSIS — G43919 Migraine, unspecified, intractable, without status migrainosus: Secondary | ICD-10-CM | POA: Diagnosis not present

## 2021-01-31 MED ORDER — FAMOTIDINE 20 MG PO TABS: 20.0000 mg | ORAL_TABLET | Freq: Two times a day (BID) | ORAL | 5 refills | Status: AC | PRN

## 2021-01-31 MED ORDER — SUMATRIPTAN SUCCINATE 100 MG PO TABS: 100.0000 mg | ORAL_TABLET | Freq: Once | ORAL | 2 refills | Status: AC | PRN

## 2021-01-31 MED ORDER — NADOLOL 80 MG PO TABS: ORAL_TABLET | ORAL | 3 refills | Status: DC

## 2021-01-31 NOTE — Progress Notes (Signed)
Assessment and Plan:  Isabel Hubbard was seen today for hypertension.  Diagnoses and all orders for this visit:  Hypertension, unspecified type Restart nadolol, has done well with this for many years Discussed and restart Monitor blood pressure at home; call if consistently over 130/80 Continue DASH diet.   Reminder to go to the ER if any CP, SOB, nausea, dizziness, severe HA, changes vision/speech, left arm numbness and tingling and jaw pain. Follow up in 3 months for follow up and labs -     nadolol (CORGARD) 80 MG tablet; Take 1 tab daily at night for blood pressure and migraine prophylaxis.  Intractable migraine without status migrainosus, unspecified migraine type No longer breast feeding - restart migraine meds -     nadolol (CORGARD) 80 MG tablet; Take 1 tab daily at night for blood pressure and migraine prophylaxis. -     SUMAtriptan (IMITREX) 100 MG tablet; Take 1 tablet (100 mg total) by mouth once as needed for migraine. May repeat in 2 hours if headache persists or recurs.  Gastroesophageal reflux disease, unspecified whether esophagitis present Restart H2i PRN, has <2 days/ week Discussed diet, avoiding triggers and other lifestyle changes -     famotidine (PEPCID) 20 MG tablet; Take 1 tablet (20 mg total) by mouth 2 (two) times daily as needed for heartburn or indigestion.  Further disposition pending results of labs. Discussed med's effects and SE's.   Over 30 minutes of exam, counseling, chart review, and critical decision making was performed.   No future appointments.  ------------------------------------------------------------------------------------------------------------------   HPI BP 132/84   Pulse 65   Temp 97.9 F (36.6 C)   Ht 5\' 8"  (1.727 m)   Wt 176 lb (79.8 kg)   SpO2 98%   BMI 26.76 kg/m   30 y.o.female with hx of htn and migraine presents for evaluation due to elevated BP. She has been on nadolol 80 mg for several years, reports quit job last fall  with lower stress, stopping taking nadolol, reports recently at derm had BP "150/something", has been checking at home with 130s/80s. She does note had 2 migraines since then, previously had been very rare. Has also been off of HBC, changing agents.   Today their BP is BP: 132/84 She denies chest pain, shortness of breath, dizziness.    Past Medical History:  Diagnosis Date  . Allergy   . Anxiety   . Asthma   . Depression   . Hypertension      Allergies  Allergen Reactions  . Dexamethasone Rash  . Sulfa Antibiotics Rash  . Naproxen Other (See Comments)    GERD/Reflux  . Betadine [Povidone Iodine] Rash  . Penicillins Rash  . Prednisone Rash  . Tessalon Perles [Benzonatate] Rash    Current Outpatient Medications on File Prior to Visit  Medication Sig  . albuterol (VENTOLIN HFA) 108 (90 Base) MCG/ACT inhaler Inhale 2 puffs into the lungs every 4 (four) hours as needed for wheezing or shortness of breath.  . montelukast (SINGULAIR) 10 MG tablet Take     1 tablet     Daily      for Allergies  . Norethin-Eth Estrad-Fe Biphas (LO LOESTRIN FE PO) Take by mouth.  . ALPRAZolam (XANAX XR) 0.5 MG 24 hr tablet TAKE 1 TABLET BY MOUTH EVERY DAY AS NEEDED FOR ANXIETY (Patient not taking: No sig reported)   No current facility-administered medications on file prior to visit.    ROS: all negative except above.   Physical Exam:  BP 132/84   Pulse 65   Temp 97.9 F (36.6 C)   Ht 5\' 8"  (1.727 m)   Wt 176 lb (79.8 kg)   SpO2 98%   BMI 26.76 kg/m   General Appearance: Well nourished, in no apparent distress. Eyes: PERRL,conjunctiva no swelling or erythema ENT/Mouth: mask in place; Hearing normal.  Neck: Supple, thyroid normal.  Respiratory: Respiratory effort normal, BS equal bilaterally without rales, rhonchi, wheezing or stridor.  Cardio: RRR with no MRGs. Brisk peripheral pulses without edema.  Lymphatics: Non tender without lymphadenopathy.  Musculoskeletal:  normal gait.   Skin: Warm, dry without rashes, lesions, ecchymosis.  Neuro: Normal muscle tone Psych: Awake and oriented X 3, normal affect, Insight and Judgment appropriate.     , NP 12:10 PM [facility]

## 2021-06-26 ENCOUNTER — Encounter: Payer: PRIVATE HEALTH INSURANCE | Admitting: Adult Health

## 2022-03-19 ENCOUNTER — Other Ambulatory Visit: Payer: Self-pay | Admitting: Adult Health

## 2022-03-19 DIAGNOSIS — G43919 Migraine, unspecified, intractable, without status migrainosus: Secondary | ICD-10-CM

## 2022-03-19 DIAGNOSIS — I10 Essential (primary) hypertension: Secondary | ICD-10-CM

## 2023-09-09 ENCOUNTER — Emergency Department (HOSPITAL_BASED_OUTPATIENT_CLINIC_OR_DEPARTMENT_OTHER)
Admission: EM | Admit: 2023-09-09 | Discharge: 2023-09-09 | Disposition: A | Discharge status: Home / Self Care | Payer: PRIVATE HEALTH INSURANCE

## 2023-09-09 ENCOUNTER — Encounter (HOSPITAL_BASED_OUTPATIENT_CLINIC_OR_DEPARTMENT_OTHER): Payer: Self-pay

## 2023-09-09 ENCOUNTER — Other Ambulatory Visit: Payer: Self-pay

## 2023-09-09 ENCOUNTER — Emergency Department (HOSPITAL_BASED_OUTPATIENT_CLINIC_OR_DEPARTMENT_OTHER): Payer: PRIVATE HEALTH INSURANCE

## 2023-09-09 DIAGNOSIS — R109 Unspecified abdominal pain: Secondary | ICD-10-CM

## 2023-09-09 DIAGNOSIS — R1032 Left lower quadrant pain: Secondary | ICD-10-CM | POA: Insufficient documentation

## 2023-09-09 LAB — URINALYSIS, ROUTINE W REFLEX MICROSCOPIC
Bilirubin Urine: NEGATIVE
Glucose, UA: NEGATIVE mg/dL
Ketones, ur: NEGATIVE mg/dL
Leukocytes,Ua: NEGATIVE
Nitrite: NEGATIVE
Protein, ur: NEGATIVE mg/dL
Specific Gravity, Urine: >=1.03 (ref 1.005–1.030)
pH: 6 (ref 5.0–8.0)

## 2023-09-09 LAB — CBC WITH DIFFERENTIAL/PLATELET
Abs Immature Granulocytes: 0.02 10*3/uL (ref 0.00–0.07)
Basophils Absolute: 0 10*3/uL (ref 0.0–0.1)
Basophils Relative: 0 %
Eosinophils Absolute: 0.2 10*3/uL (ref 0.0–0.5)
Eosinophils Relative: 2 %
HCT: 39.7 % (ref 36.0–46.0)
Hemoglobin: 13.5 g/dL (ref 12.0–15.0)
Immature Granulocytes: 0 %
Lymphocytes Relative: 33 %
Lymphs Abs: 2.9 10*3/uL (ref 0.7–4.0)
MCH: 30.8 pg (ref 26.0–34.0)
MCHC: 34 g/dL (ref 30.0–36.0)
MCV: 90.6 fL (ref 80.0–100.0)
Monocytes Absolute: 0.6 10*3/uL (ref 0.1–1.0)
Monocytes Relative: 7 %
Neutro Abs: 5.1 10*3/uL (ref 1.7–7.7)
Neutrophils Relative %: 58 %
Platelets: 248 10*3/uL (ref 150–400)
RBC: 4.38 MIL/uL (ref 3.87–5.11)
RDW: 12.1 % (ref 11.5–15.5)
WBC: 8.8 10*3/uL (ref 4.0–10.5)
nRBC: 0 % (ref 0.0–0.2)

## 2023-09-09 LAB — URINALYSIS, MICROSCOPIC (REFLEX)

## 2023-09-09 LAB — COMPREHENSIVE METABOLIC PANEL
ALT: 17 U/L (ref 0–44)
AST: 16 U/L (ref 15–41)
Albumin: 3.8 g/dL (ref 3.5–5.0)
Alkaline Phosphatase: 62 U/L (ref 38–126)
Anion gap: 9 (ref 5–15)
BUN: 14 mg/dL (ref 6–20)
CO2: 26 mmol/L (ref 22–32)
Calcium: 8.9 mg/dL (ref 8.9–10.3)
Chloride: 101 mmol/L (ref 98–111)
Creatinine, Ser: 0.72 mg/dL (ref 0.44–1.00)
GFR, Estimated: >60 mL/min (ref >60)
Glucose, Bld: 114 mg/dL — ABNORMAL HIGH (ref 70–99)
Potassium: 4.1 mmol/L (ref 3.5–5.1)
Sodium: 136 mmol/L (ref 135–145)
Total Bilirubin: 0.7 mg/dL (ref <1.2)
Total Protein: 7.4 g/dL (ref 6.5–8.1)

## 2023-09-09 LAB — PREGNANCY, URINE: Preg Test, Ur: NEGATIVE

## 2023-09-09 MED ORDER — KETOROLAC TROMETHAMINE 10 MG PO TABS
10.0000 mg | ORAL_TABLET | Freq: Once | ORAL | Status: AC
  Administered 2023-09-09: 10 mg via ORAL
  Filled 2023-09-09: qty 1

## 2023-09-09 MED ORDER — KETOROLAC TROMETHAMINE 15 MG/ML IJ SOLN: 15.0000 mg | Freq: Once | INTRAMUSCULAR | Status: DC

## 2023-09-09 MED ORDER — POLYETHYLENE GLYCOL 3350 17 GM/SCOOP PO POWD: 1.0000 | Freq: Once | ORAL | 0 refills | Status: AC

## 2023-09-09 MED ORDER — OXYCODONE-ACETAMINOPHEN 5-325 MG PO TABS
1.0000 | ORAL_TABLET | Freq: Once | ORAL | Status: AC
  Administered 2023-09-09: 1 via ORAL
  Filled 2023-09-09: qty 1

## 2023-09-09 MED ORDER — ONDANSETRON HCL 4 MG PO TABS: 4.0000 mg | ORAL_TABLET | Freq: Four times a day (QID) | ORAL | 0 refills | Status: AC

## 2023-09-09 MED ORDER — METOCLOPRAMIDE HCL 5 MG/ML IJ SOLN: 10.0000 mg | Freq: Once | INTRAMUSCULAR | Status: DC

## 2023-09-09 MED ORDER — METOCLOPRAMIDE HCL 10 MG PO TABS
10.0000 mg | ORAL_TABLET | Freq: Once | ORAL | Status: AC
  Administered 2023-09-09: 10 mg via ORAL
  Filled 2023-09-09: qty 1

## 2023-09-09 NOTE — ED Triage Notes (Signed)
Pt has had left sided flank pain x 3 weeks and now has left lower quad pain as well Hx of endometriosis and IBS No c/o dysuria

## 2023-09-09 NOTE — Discharge Instructions (Addendum)
Please follow-up with your primary doctor.  Return immediately for fevers, chills, chest pain, worsening abdominal pain, ability drink due to nausea vomiting or develop any new or worsening symptoms that are concerning to you.

## 2023-09-09 NOTE — ED Notes (Signed)
Pt transported to imaging.

## 2023-09-09 NOTE — ED Provider Notes (Signed)
[place] [facility] Provider Note   CSN: [id number] Arrival date & time: 09/09/23  1918     History  Chief Complaint  Patient presents with   Flank Pain    Isabel Hubbard is a 32 y.o. female.  32 year old female present emergency department left-sided flank pain.  She also notes some left lower abdominal pain has been going on for some time as well.  Reports left flank pain since Monday.  Worsening to that time.  Some nausea, no vomiting.  No dysuria.   Flank Pain       Home Medications Prior to Admission medications   Medication Sig Start Date End Date Taking? Authorizing Provider  albuterol (VENTOLIN HFA) 108 (90 Base) MCG/ACT inhaler Inhale 2 puffs into the lungs every 4 (four) hours as needed for wheezing or shortness of breath. 04/06/18   Lucky Cowboy, MD  ALPRAZolam (XANAX XR) 0.5 MG 24 hr tablet TAKE 1 TABLET BY MOUTH EVERY DAY AS NEEDED FOR ANXIETY Patient not taking: No sig reported 07/10/19   Judd Gaudier, NP  famotidine (PEPCID) 20 MG tablet Take 1 tablet (20 mg total) by mouth 2 (two) times daily as needed for heartburn or indigestion. 01/31/21   Judd Gaudier, NP  montelukast (SINGULAIR) 10 MG tablet Take     1 tablet     Daily      for Allergies 07/27/20   Lucky Cowboy, MD  nadolol (CORGARD) 80 MG tablet Take 1 tab daily at night for blood pressure and migraine prophylaxis. NEED TO SCHEDULE A FOLLOW UP FOR FURTHER REFILLS. 03/19/22   Judd Gaudier, NP  Norethin-Eth Estrad-Fe Biphas (LO LOESTRIN FE PO) Take by mouth.    [provider]  SUMAtriptan (IMITREX) 100 MG tablet Take 1 tablet (100 mg total) by mouth once as needed for migraine. May repeat in 2 hours if headache persists or recurs. 01/31/21 02/01/22  Judd Gaudier, NP      Allergies    Dexamethasone, Sulfa antibiotics, Naproxen, Betadine [povidone iodine], Penicillins, Prednisone, and Tessalon perles [benzonatate]    Review of Systems   Review  of Systems  Genitourinary:  Positive for flank pain.    Physical Exam Updated Vital Signs BP (!) 116/90   Pulse (!) 52   Temp 98.1 F (36.7 C) (Oral)   Resp 19   Ht 5\' 7"  (1.702 m)   Wt 79.4 kg   SpO2 100%   BMI 27.41 kg/m  Physical Exam Vitals and nursing note reviewed.  Constitutional:      General: She is not in acute distress.    Appearance: She is obese. She is not toxic-appearing.  HENT:     Mouth/Throat:     Mouth: Mucous membranes are moist.  Eyes:     Conjunctiva/sclera: Conjunctivae normal.  Cardiovascular:     Rate and Rhythm: Normal rate and regular rhythm.  Pulmonary:     Effort: Pulmonary effort is normal.     Breath sounds: Normal breath sounds.  Abdominal:     General: Abdomen is flat. There is no distension.     Tenderness: There is no abdominal tenderness. There is left CVA tenderness. There is no guarding.  Musculoskeletal:        General: Normal range of motion.  Skin:    General: Skin is warm and dry.     Capillary Refill: Capillary refill takes less than 2 seconds.  Neurological:     Mental Status: She is alert.  Psychiatric:  Mood and Affect: Mood normal.        Behavior: Behavior normal.     ED Results / Procedures / Treatments   Labs (all labs ordered are listed, but only abnormal results are displayed) Labs Reviewed  URINALYSIS, ROUTINE W REFLEX MICROSCOPIC - Abnormal; Notable for the following components:      Result Value   Hgb urine dipstick TRACE (*)    All other components within normal limits  COMPREHENSIVE METABOLIC PANEL - Abnormal; Notable for the following components:   Glucose, Bld 114 (*)    All other components within normal limits  URINALYSIS, MICROSCOPIC (REFLEX) - Abnormal; Notable for the following components:   Bacteria, UA FEW (*)    All other components within normal limits  PREGNANCY, URINE  CBC WITH DIFFERENTIAL/PLATELET    EKG None  Radiology CT Renal Stone Study  Result Date:  09/09/2023 CLINICAL DATA:  Flank pain. EXAM: CT ABDOMEN AND PELVIS WITHOUT CONTRAST TECHNIQUE: Multidetector CT imaging of the abdomen and pelvis was performed following the standard protocol without IV contrast. RADIATION DOSE REDUCTION: This exam was performed according to the departmental dose-optimization program which includes automated exposure control, adjustment of the mA and/or kV according to patient size and/or use of iterative reconstruction technique. COMPARISON:  09/21/2014. FINDINGS: Lower chest: No acute abnormality. No pericardial or pleural effusion. Hepatobiliary: No focal liver abnormality is seen. Status post cholecystectomy. No biliary dilatation. Pancreas: Unremarkable. No pancreatic ductal dilatation or surrounding inflammatory changes. Spleen: Normal in size without focal abnormality. Adrenals/Urinary Tract: Adrenal glands are unremarkable. Kidneys are normal, without renal calculi, focal lesion, or hydronephrosis. Bladder is unremarkable. 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 No bowel dilatation to suggest obstruction. Appendix not seen. No evidence of appendicitis. Vascular/Lymphatic: No significant vascular findings are present. No enlarged abdominal or pelvic lymph nodes. Reproductive: Uterus and bilateral adnexa are unremarkable. Other: No abdominal wall hernia or abnormality. No abdominopelvic ascites. Musculoskeletal: No acute or significant osseous findings. IMPRESSION: No obstructive uropathy. No acute abdominal or pelvic pathology identified. Electronically Signed   By: Layla Maw M.D.   On: 09/09/2023 22:36    Procedures Procedures    Medications Ordered in ED Medications  ketorolac (TORADOL) tablet 10 mg (10 mg Oral Given 09/09/23 2200)  metoCLOPramide (REGLAN) tablet 10 mg (10 mg Oral Given 09/09/23 2200)  oxyCODONE-acetaminophen (PERCOCET/ROXICET) 5-325 MG per tablet 1 tablet (1 tablet Oral Given 09/09/23 2259)     ED Course/ Medical Decision Making/ A&P Clinical Course as of 09/09/23 2300  Thu Sep 09, 2023  2253 CT Renal Larina Bras Study [TY]    Clinical Course User Index [TY] Coral Spikes, DO                                 Medical Decision Making Is a well-appearing 32 year old female presenting emergency department for left-sided flank pain.  She is afebrile nontachycardic hemodynamic stable.  Physical exam with some minor tenderness in her left flank area.  Labs largely reassuring from triage.  No leukocytosis to suggest systemic infection.  Comprehensive metabolic panel with no transaminitis to suggest hepatobiliary disease.  No significant metabolic derangements.  Normal kidney function.  UA does have some hematuria.  But no obvious infection.  Given location concern for possible stone.  Will treat with Toradol and get CT scan.  Patient also complains of migraine.  States she has history of the same and this  is atypical headache for her.  Will give her Reglan for headache and nausea.  Updated MDM; CT negative for acute pathology.  Reviewed; does have some constipation.  She is tolerating p.o.  Reassuring workup.  Will discharge with antiemetics, close patient follow-up.  Amount and/or Complexity of Data Reviewed Labs: ordered. Radiology: ordered. Decision-making details documented in ED Course.  Risk Prescription drug management.          Final Clinical Impression(s) / ED Diagnoses Final diagnoses:  Flank pain    Rx / DC Orders ED Discharge Orders     None         Coral Spikes, DO 09/09/23 2300
